# Patient Record
Sex: Male | Born: 1970 | Race: White | Hispanic: No | Marital: Married | Smoking: Never smoker
Health system: Southern US, Community
[De-identification: ages and names within clinical notes are randomized; demographics above are authoritative.]

## PROBLEM LIST (undated history)

## (undated) DIAGNOSIS — R1310 Dysphagia, unspecified: Secondary | ICD-10-CM

## (undated) DIAGNOSIS — R7303 Prediabetes: Secondary | ICD-10-CM

## (undated) DIAGNOSIS — R5383 Other fatigue: Secondary | ICD-10-CM

## (undated) DIAGNOSIS — G4700 Insomnia, unspecified: Secondary | ICD-10-CM

## (undated) DIAGNOSIS — K5900 Constipation, unspecified: Secondary | ICD-10-CM

## (undated) DIAGNOSIS — M1990 Unspecified osteoarthritis, unspecified site: Secondary | ICD-10-CM

## (undated) DIAGNOSIS — B009 Herpesviral infection, unspecified: Secondary | ICD-10-CM

## (undated) DIAGNOSIS — E119 Type 2 diabetes mellitus without complications: Secondary | ICD-10-CM

## (undated) DIAGNOSIS — R0602 Shortness of breath: Secondary | ICD-10-CM

## (undated) HISTORY — DX: Unspecified osteoarthritis, unspecified site: M19.90

## (undated) HISTORY — DX: Other fatigue: R53.83

## (undated) HISTORY — DX: Type 2 diabetes mellitus without complications: E11.9

## (undated) HISTORY — DX: Dysphagia, unspecified: R13.10

## (undated) HISTORY — DX: Insomnia, unspecified: G47.00

## (undated) HISTORY — DX: Constipation, unspecified: K59.00

## (undated) HISTORY — DX: Prediabetes: R73.03

## (undated) HISTORY — DX: Herpesviral infection, unspecified: B00.9

## (undated) HISTORY — DX: Shortness of breath: R06.02

---

## 2005-03-25 ENCOUNTER — Ambulatory Visit: Payer: Self-pay | Admitting: Family Medicine

## 2005-04-12 ENCOUNTER — Ambulatory Visit: Payer: Self-pay | Admitting: Family Medicine

## 2005-11-11 ENCOUNTER — Ambulatory Visit: Payer: Self-pay | Admitting: Internal Medicine

## 2005-11-25 ENCOUNTER — Ambulatory Visit: Payer: Self-pay | Admitting: Internal Medicine

## 2006-12-08 ENCOUNTER — Ambulatory Visit: Payer: Self-pay | Admitting: Family Medicine

## 2006-12-08 DIAGNOSIS — L738 Other specified follicular disorders: Secondary | ICD-10-CM | POA: Insufficient documentation

## 2006-12-18 ENCOUNTER — Telehealth (INDEPENDENT_AMBULATORY_CARE_PROVIDER_SITE_OTHER): Payer: Self-pay | Admitting: *Deleted

## 2006-12-19 ENCOUNTER — Telehealth (INDEPENDENT_AMBULATORY_CARE_PROVIDER_SITE_OTHER): Payer: Self-pay | Admitting: *Deleted

## 2007-05-30 ENCOUNTER — Telehealth: Payer: Self-pay | Admitting: Family Medicine

## 2007-10-23 ENCOUNTER — Telehealth (INDEPENDENT_AMBULATORY_CARE_PROVIDER_SITE_OTHER): Payer: Self-pay | Admitting: *Deleted

## 2007-12-26 ENCOUNTER — Telehealth (INDEPENDENT_AMBULATORY_CARE_PROVIDER_SITE_OTHER): Payer: Self-pay | Admitting: *Deleted

## 2008-10-07 ENCOUNTER — Ambulatory Visit: Payer: Self-pay | Admitting: Family Medicine

## 2008-10-07 DIAGNOSIS — B070 Plantar wart: Secondary | ICD-10-CM | POA: Insufficient documentation

## 2008-10-07 DIAGNOSIS — M5412 Radiculopathy, cervical region: Secondary | ICD-10-CM | POA: Insufficient documentation

## 2008-10-13 ENCOUNTER — Encounter: Payer: Self-pay | Admitting: Family Medicine

## 2008-10-13 ENCOUNTER — Encounter (INDEPENDENT_AMBULATORY_CARE_PROVIDER_SITE_OTHER): Payer: Self-pay | Admitting: *Deleted

## 2008-11-10 ENCOUNTER — Telehealth: Payer: Self-pay | Admitting: Family Medicine

## 2008-11-10 DIAGNOSIS — R209 Unspecified disturbances of skin sensation: Secondary | ICD-10-CM

## 2009-01-29 ENCOUNTER — Telehealth (INDEPENDENT_AMBULATORY_CARE_PROVIDER_SITE_OTHER): Payer: Self-pay | Admitting: *Deleted

## 2009-07-24 ENCOUNTER — Ambulatory Visit: Payer: Self-pay | Admitting: Family Medicine

## 2009-07-24 DIAGNOSIS — L03019 Cellulitis of unspecified finger: Secondary | ICD-10-CM

## 2009-07-24 DIAGNOSIS — J019 Acute sinusitis, unspecified: Secondary | ICD-10-CM

## 2009-07-24 DIAGNOSIS — E663 Overweight: Secondary | ICD-10-CM | POA: Insufficient documentation

## 2010-04-20 NOTE — Assessment & Plan Note (Signed)
Summary: finger nail discolored/cbs   Vital Signs:  Patient profile:   40 year old male Height:      69 inches Weight:      201.25 pounds BMI:     29.83 BP sitting:   122 / 80  (left arm) Cuff size:   regular  Vitals Entered By: Army Fossa CMA (Jul 24, 2009 3:55 PM)  Nutrition Counseling: Patient's BMI is greater than 25 and therefore counseled on weight management options. CC: Jose [name] thumb finger nail has some discoloration. was diagnosied with Strep last saturday, look at throat. Feeling better. Done with ATB- was seen in UC in [place]   History of Present Illness: Jose [name] c/o discoloration of nail on Left hand after piece of orange got under it.   He was put on omnicef for 5 days for strep throat-- he still congested and feels he had a sinus infection.   He also has questions about weight loss.  He is not exercising and admits his diet could be better.   Preventive Screening-Counseling & Management  Caffeine-Diet-Exercise     Diet Comments: poor     Does Patient Exercise: no  Current Medications (verified): 1)  Afrin Sinus 0.05 % Soln (Oxymetazoline Hcl) 2)  Omnicef 300mg  .... 1 By Mouth Two Times A Day 3)  Nasonex 50 Mcg/act Susp (Mometasone Furoate) .... 2 Sprays Each Nostril Once Daily  Allergies (verified): No Known Drug Allergies  Past History:  Past medical, surgical, family and social histories (including risk factors) reviewed for relevance to current acute and chronic problems.  Past Medical History: Reviewed history from 12/08/2006 and no changes required. HSV II  Family History: Reviewed history and no changes required.  Social History: Reviewed history and no changes required. Does Patient Exercise:  no  Review of Systems      See HPI  Physical Exam  General:  Well-developed,well-nourished,in no acute distress; alert,appropriate and cooperative throughout examination Ears:  External ear exam shows no significant lesions or deformities.   Otoscopic examination reveals clear canals, tympanic membranes are intact bilaterally without bulging, retraction, inflammation or discharge. Hearing is grossly normal bilaterally. Nose:  L frontal sinus tenderness, L maxillary sinus tenderness, R frontal sinus tenderness, and R maxillary sinus tenderness.   Mouth:  Oral mucosa and oropharynx without lesions or exudates.  Teeth in good repair. Neck:  No deformities, masses, or tenderness noted. Extremities:  L thumbnail--+ hematoma--tiny no d/c non tender Psych:  Oriented X3 and normally interactive.     Impression & Recommendations:  Problem # 1:  SINUSITIS - ACUTE-NOS (ICD-461.9)  His updated medication list for this problem includes:    Afrin Sinus 0.05 % Soln (Oxymetazoline hcl)    Nasonex 50 Mcg/act Susp (Mometasone furoate) .Marland Kitchen... 2 sprays each nostril once daily    omnicef for 5 more days  Instructed on treatment. Call if symptoms persist or worsen.   Problem # 2:  ONYCHIA AND PARONYCHIA OF FINGER (ICD-681.02)  omnicef 5 more days soak with H2O2 and warm water  Elevate affected area. Warm moist compresses for 20 minutes every 2 hours while awake. Take antibiotics as directed and take acetaminophen as needed. To be seen in 48-72 hours if no improvement, sooner if worse.  Problem # 3:  OVERWEIGHT (ICD-278.02) Assessment: Unchanged  Ht: 69 (07/24/2009)   Wt: 201.25 (07/24/2009)   BMI: 29.83 (07/24/2009) recc.  Flat Belly Diet  by Prevention and increasing exercise and include weight training  Complete Medication List: 1)  Afrin Sinus 0.05 % Soln (Oxymetazoline hcl) 2)  Omnicef 300mg   .... 1 by mouth two times a day 3)  Nasonex 50 Mcg/act Susp (Mometasone furoate) .... 2 sprays each nostril once daily Prescriptions: NASONEX 50 MCG/ACT SUSP (MOMETASONE FUROATE) 2 sprays each nostril once daily  #1 x 0   Entered and Authorized by:   Loreen Freud DO   Signed by:   Loreen Freud DO on 07/24/2009   Method used:   Historical    RxID:   [id number] OMNICEF 300MG  1 by mouth two times a day  #10 x 0   Entered and Authorized by:   Loreen Freud DO   Signed by:   Loreen Freud DO on 07/24/2009   Method used:   Faxed to ...       [facility] [phone]* (retail)       [address]       [place], [place]  [address]       Ph: [phone]       Fax: [phone]   RxID:   [id number]

## 2010-07-20 ENCOUNTER — Telehealth: Payer: Self-pay | Admitting: *Deleted

## 2010-07-20 MED ORDER — ACYCLOVIR 400 MG PO TABS: 400.0000 mg | ORAL_TABLET | Freq: Two times a day (BID) | ORAL | Status: AC

## 2010-07-20 NOTE — Telephone Encounter (Signed)
Pt aware Rx sent to pharmacy 

## 2010-07-20 NOTE — Telephone Encounter (Signed)
Zovirax 400mg   Bid #60  11 refills

## 2010-07-20 NOTE — Telephone Encounter (Signed)
Pt states that due to recent insurance change he would like to change Rx from Valtrex to acyclovir. Pt would like to get med fill for year supply or as many fills as possible. Pt use [facility]. Last OV 07-24-09, last filled valtrex 1 gm, 01-29-09 #90 1. Please advise

## 2011-08-04 ENCOUNTER — Ambulatory Visit (INDEPENDENT_AMBULATORY_CARE_PROVIDER_SITE_OTHER): Payer: Managed Care, Other (non HMO) | Admitting: Family Medicine

## 2011-08-04 ENCOUNTER — Other Ambulatory Visit: Payer: Self-pay | Admitting: Family Medicine

## 2011-08-04 ENCOUNTER — Telehealth: Payer: Self-pay | Admitting: Family Medicine

## 2011-08-04 ENCOUNTER — Encounter: Payer: Self-pay | Admitting: Family Medicine

## 2011-08-04 VITALS — BP 110/73 | HR 88 | Temp 97.7°F | Ht 69.0 in | Wt 195.0 lb

## 2011-08-04 DIAGNOSIS — Z789 Other specified health status: Secondary | ICD-10-CM

## 2011-08-04 DIAGNOSIS — Z9189 Other specified personal risk factors, not elsewhere classified: Secondary | ICD-10-CM

## 2011-08-04 DIAGNOSIS — R197 Diarrhea, unspecified: Secondary | ICD-10-CM

## 2011-08-04 DIAGNOSIS — R5381 Other malaise: Secondary | ICD-10-CM

## 2011-08-04 DIAGNOSIS — J029 Acute pharyngitis, unspecified: Secondary | ICD-10-CM

## 2011-08-04 DIAGNOSIS — B349 Viral infection, unspecified: Secondary | ICD-10-CM | POA: Insufficient documentation

## 2011-08-04 DIAGNOSIS — R5383 Other fatigue: Secondary | ICD-10-CM | POA: Insufficient documentation

## 2011-08-04 DIAGNOSIS — B9789 Other viral agents as the cause of diseases classified elsewhere: Secondary | ICD-10-CM

## 2011-08-04 LAB — POCT RAPID STREP A (OFFICE): Rapid Strep A Screen: NEGATIVE

## 2011-08-04 NOTE — Progress Notes (Signed)
OFFICE NOTE  08/04/2011  CC:  Chief Complaint  Patient presents with  . Fatigue    tick bite 07/23/11, wonders if could be related, also diarrhea, HA     HPI: Patient is a 41 y.o. Caucasian male who is here for chief complaint of fatigue. He describes an illness that began approximately 2 wks ago with mild pain in soft palate area and headache. No fever or significant URI sx's (he had his usual "mild" hay fever sx's) but just a mild cough.  No SOB, no neck pain/stiffness, no rash.  He did have a hx of tick bite (tick on him approx 24-36h) sometime in the week or so prior to onset of sx's.  His wife and children did also have respiratory illnesses at that time. [facility] and "nothing much" was found but with hx of tick bite he was rx'd a 10 day course of doxycycline, which he finishes today.   After getting on the doxy, he felt better after 3-4d, but then the last few days his sx's of HA, palate pain, and mild cough have returned, and most notably today he feels very tired/generalized fatigue, with vague/mild body achiness, L>R side.  No focal weakness.  Still no fevers.  Also has had 2-3 watery BMs/day last few days, one was nocturnal.  No blood noted. No abd cramping.  His appetite is good.  Denies vision or hearing symptoms. No other recent abx besides his current doxycycline.  Pertinent PMH:  Mild seasonal allergic rhinitis.  No signif past illnesses, no chronic illnesses, no past surgeries or procedures.  Social Hx: married 3 children.  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  No T/A/Ds.  MEDS:  Doxycycline ?dose bid x 10d (finishes today) Advil prn. Zyrtec prn  ROS: see HPI.  Also, no wheezing, no chest tightness, no SOB, no nausea, no numbness or tingling anywhere, no swelling or redness of any joints, no palpitations, no chest pain, no dizziness.  PE: Blood pressure 110/73, pulse 88, temperature 97.7 F (36.5 C), temperature source Temporal, height 5\' 9"  (1.753 m), weight 195  lb (88.451 kg). Gen: Alert, well appearing.  Patient is oriented to person, place, time, and situation. ENT: Ears: EACs clear, normal epithelium.  TMs with good light reflex and landmarks bilaterally.  Eyes: no injection, icteris, swelling, or exudate.  EOMI, PERRLA. Nose: no drainage but mild bilateral turbinate congestion, without purulent d/c.  Mouth: lips without lesion/swelling.  Oral mucosa pink and moist.  Dentition intact and without obvious caries or gingival swelling.  Oropharynx without erythema, exudate, or focal lesion.  He has the suggestion of a bit of mild beefy swelling in posterior pharynx area diffusely, ?clear PND.  No convincing palatal lesions to suggest petechiae. NECK: mild ant cerv LAD, mildly tender on right.  No neck stiffness, no tenderness to posterior/lateral musculature of the neck.  No posterior cervical LAD.  Neck ROM normal. CV: RRR, no m/r/g.   LUNGS: CTA bilat, nonlabored resps, good aeration in all lung fields. ABD: soft, NT, ND, BS normal.  No hepatospenomegaly or mass.  No bruits. EXT: no clubbing, cyanosis, or edema.  Skin - no sores or suspicious lesions or rashes or color changes Musc: no joint swelling, erythema, or tenderness  LABS: Rapid strep today was negative.  IMPRESSION AND PLAN:  Viral syndrome He looks a little tired today but otherwise very good. Reassured pt, suggested he go ahead and take last dose of doxy but I recommend no further antibiotics.  In fact, no meds recommended at this time except advil prn and zyrtec prn. I will check a monospot, parvo titers, RMSF and Lyme titers, as well as CBC w/diff, CMET, and TSH. I have low suspicion of tick borne illness at this time and low suspicion of his diarrhea being from c diff at this time. Recommended pt rest, stay home from work if he feels bad again tomorrow, push clear fluids. Discussed f/u plan and decided on prn f/u.   Strep culture was sent.   FOLLOW UP: prn

## 2011-08-04 NOTE — Assessment & Plan Note (Signed)
He looks a little tired today but otherwise very good. Reassured pt, suggested he go ahead and take last dose of doxy but I recommend no further antibiotics. In fact, no meds recommended at this time except advil prn and zyrtec prn. I will check a monospot, parvo titers, RMSF and Lyme titers, as well as CBC w/diff, CMET, and TSH. I have low suspicion of tick borne illness at this time and low suspicion of his diarrhea being from c diff at this time. Recommended pt rest, stay home from work if he feels bad again tomorrow, push clear fluids. Discussed f/u plan and decided on prn f/u.

## 2011-08-04 NOTE — Telephone Encounter (Signed)
aller: Jelan/Patient; PCP: Lelon Perla.; CB#: [phone]; ; ; Call regarding Fatique;   Today, 08/04/2011 , has had onset of  fatique and achiness. No known fever. [facility] 10 days [facility] for sinus pain and congestion ,  dx with sinus  infection and given Doxycyline. Sx's started to improve,  but has developed cough since 08/03/2011- HA  x 2 days  also relieves with Advil.  Also states was bite by tick ~ 15 days ago and wonders if any of the sx's could be related.  No change in the tick bite area. RN reached See in 4 hrs DISP for history of tick bite and has HA and muscle pain per Bite and Stings   Protocol -- See in 24 hrs also reached for  chest pain with cough and deep breath per Cough protocol --  No EPIC appts for today and no appts for Sandford Craze NP  at [facility] office.   RN called office  and " Shrae"  adv pt can be seen at [facility] and for pt to call [phone] or go to [facility] today. RN discussed with pt and he will call [facility] office for appt today.

## 2011-08-05 ENCOUNTER — Other Ambulatory Visit: Payer: Self-pay | Admitting: Family Medicine

## 2011-08-05 ENCOUNTER — Ambulatory Visit (HOSPITAL_BASED_OUTPATIENT_CLINIC_OR_DEPARTMENT_OTHER)
Admission: RE | Admit: 2011-08-05 | Discharge: 2011-08-05 | Disposition: A | Discharge status: Home / Self Care | Payer: Managed Care, Other (non HMO) | Source: Ambulatory Visit | Attending: Family Medicine | Admitting: Family Medicine

## 2011-08-05 ENCOUNTER — Encounter: Payer: Self-pay | Admitting: Family Medicine

## 2011-08-05 DIAGNOSIS — D72829 Elevated white blood cell count, unspecified: Secondary | ICD-10-CM

## 2011-08-05 DIAGNOSIS — R739 Hyperglycemia, unspecified: Secondary | ICD-10-CM | POA: Insufficient documentation

## 2011-08-05 DIAGNOSIS — R5383 Other fatigue: Secondary | ICD-10-CM

## 2011-08-05 DIAGNOSIS — R5381 Other malaise: Secondary | ICD-10-CM | POA: Insufficient documentation

## 2011-08-05 LAB — HEMOGLOBIN A1C
Hgb A1c MFr Bld: 5.2 % (ref <5.7)
Mean Plasma Glucose: 103 mg/dL (ref <117)

## 2011-08-05 LAB — CBC WITH DIFFERENTIAL/PLATELET
Eosinophils Absolute: 0.2 10*3/uL (ref 0.0–0.7)
Eosinophils Relative: 1 % (ref 0–5)
HCT: 41.1 % (ref 39.0–52.0)
Hemoglobin: 14.6 g/dL (ref 13.0–17.0)
Lymphocytes Relative: 5 % — ABNORMAL LOW (ref 12–46)
Lymphs Abs: 1.5 10*3/uL (ref 0.7–4.0)
MCH: 31.5 pg (ref 26.0–34.0)
MCV: 88.6 fL (ref 78.0–100.0)
Monocytes Relative: 4 % (ref 3–12)
RBC: 4.64 MIL/uL (ref 4.22–5.81)

## 2011-08-05 LAB — COMPREHENSIVE METABOLIC PANEL
ALT: 28 U/L (ref 0–53)
Albumin: 4.6 g/dL (ref 3.5–5.2)
BUN: 16 mg/dL (ref 6–23)
CO2: 22 mEq/L (ref 19–32)
Calcium: 9.3 mg/dL (ref 8.4–10.5)
Chloride: 103 mEq/L (ref 96–112)
Creat: 0.99 mg/dL (ref 0.50–1.35)
Potassium: 3.9 mEq/L (ref 3.5–5.3)

## 2011-08-05 LAB — ROCKY MTN SPOTTED FVR ABS PNL(IGG+IGM): RMSF IgG: 0.19 IV

## 2011-08-05 LAB — MONONUCLEOSIS SCREEN: Mono Screen: NEGATIVE

## 2011-08-05 MED ORDER — MOXIFLOXACIN HCL 400 MG PO TABS: 400.0000 mg | ORAL_TABLET | Freq: Every day | ORAL | Status: AC

## 2011-08-05 NOTE — Progress Notes (Signed)
Addended by: Luisa Dago on: 08/05/2011 08:51 AM   Modules accepted: Orders

## 2011-08-08 LAB — PARVOVIRUS B19 ANTIBODY, IGG AND IGM: Parovirus B19 IgM Abs: 0.1 index (ref <0.9)

## 2011-12-25 ENCOUNTER — Other Ambulatory Visit: Payer: Self-pay | Admitting: Family Medicine

## 2011-12-26 NOTE — Telephone Encounter (Signed)
Last seen with Dr.Lowne 07/24/09----- Rx for Acyclovir 200 denied      KP

## 2011-12-26 NOTE — Telephone Encounter (Signed)
agree

## 2012-02-24 ENCOUNTER — Encounter: Payer: Self-pay | Admitting: Family

## 2012-02-24 ENCOUNTER — Ambulatory Visit (INDEPENDENT_AMBULATORY_CARE_PROVIDER_SITE_OTHER): Payer: Managed Care, Other (non HMO) | Admitting: Family

## 2012-02-24 VITALS — BP 130/70 | HR 71 | Temp 98.2°F | Resp 16 | Wt 197.1 lb

## 2012-02-24 DIAGNOSIS — M7989 Other specified soft tissue disorders: Secondary | ICD-10-CM

## 2012-02-24 MED ORDER — CEPHALEXIN 500 MG PO CAPS: 500.0000 mg | ORAL_CAPSULE | Freq: Four times a day (QID) | ORAL | Status: AC

## 2012-02-24 NOTE — Patient Instructions (Addendum)
Please call if symptoms worsen or if not improved in 2-3 days. Follow up with Dr. Laury Axon in 1 week.

## 2012-02-24 NOTE — Assessment & Plan Note (Signed)
?   Due to early abscess which is not yet visible. Will rx with Keflex.  Pt is instructed to call if he develops fever, increased pain, swelling or redness or if symptoms are not improved in 2-3 days.

## 2012-02-24 NOTE — Progress Notes (Signed)
  Subjective:    Patient ID: Jose [name], male    DOB: 02/08/1971, 41 y.o.   MRN: [id number]  HPI  [name] is a 41 yr old male who presents today with chief complaint of tenderness under the left arm.  Tenderness has been present x 10 days.  Reports associated swelling, more painful in afternoon/evening.  In the past he has had similar episodes of tenedernss.  Notes some skin redness since he shaved his arm pit last night which he attributes only to razor burn.   Review of Systems    see HPI  No past medical history on file.  History   Social History  . Marital Status: Married    Spouse Name: N/A    Number of Children: N/A  . Years of Education: N/A   Occupational History  . Not on file.   Social History Main Topics  . Smoking status: Never Smoker   . Smokeless tobacco: Never Used  . Alcohol Use: No  . Drug Use: No  . Sexually Active: Not on file   Other Topics Concern  . Not on file   Social History Narrative  . No narrative on file    No past surgical history on file.  No family history on file.  No Known Allergies  No current outpatient prescriptions on file prior to visit.    BP 130/70  Pulse 71  Temp 98.2 F (36.8 C) (Oral)  Resp 16  Wt 197 lb 1.9 oz (89.413 kg)  SpO2 99%    Objective:   Physical Exam  Constitutional: He appears well-developed and well-nourished. No distress.  Lymphadenopathy:       Left axillary swelling with superficial skin irritation.  No palpable lymph node enlargement, but exam limited due to tenderness and swelling.  No discrete abscess is noted.          Assessment & Plan:

## 2012-06-04 ENCOUNTER — Encounter: Payer: Self-pay | Admitting: Internal Medicine

## 2012-06-04 ENCOUNTER — Ambulatory Visit (INDEPENDENT_AMBULATORY_CARE_PROVIDER_SITE_OTHER): Payer: Managed Care, Other (non HMO) | Admitting: Internal Medicine

## 2012-06-04 VITALS — BP 118/76 | HR 61

## 2012-06-04 DIAGNOSIS — J019 Acute sinusitis, unspecified: Secondary | ICD-10-CM

## 2012-06-04 MED ORDER — AMOXICILLIN 500 MG PO CAPS: 500.0000 mg | ORAL_CAPSULE | Freq: Three times a day (TID) | ORAL | Status: DC

## 2012-06-04 MED ORDER — FLUTICASONE PROPIONATE 50 MCG/ACT NA SUSP: 1.0000 | Freq: Two times a day (BID) | NASAL | Status: DC | PRN

## 2012-06-04 NOTE — Progress Notes (Signed)
  Subjective:    Patient ID: Jose [name], male    DOB: 1970-09-02, 42 y.o.   MRN: [id number]  HPI  The respiratory tract symptoms began 05/27/12 as rhinitis & head congestion.  No exposures reported  to sick family, friends, work associates, allergens, or environmental factors as triggers .  Significant active  associated symptoms include nasal purulence & dry cough.  Cough is not  associated with wheezing  & dyspnea .     Extrinsic symptoms of itchy/ watery eyes &  sneezing were present .    Flu shot current   Treatment with Afrin,  OTC allergy med was partially effective. There is no history of asthma; PMH of  seasonal  allergies.  The patient had never smoked   OD upper lid lesion X 3 weeks, ? Herpetic as better with Valtrex                   Review of Systems Symptoms not present include frontal headache,  facial pain, dental pain,  sore throat, earache, &  otic discharge. Fever, chills, sweats were not present . Myalgias and arthralgias were not present.     Objective:   Physical Exam General appearance:good health ;well nourished; no acute distress or increased work of breathing is present.   Eyes: No conjunctival inflammation or lid edema is present. EOMI & vision normal with lenses Ears:  External ear exam shows no significant lesions or deformities.  Otoscopic examination reveals clear canals, tympanic membranes are intact bilaterally without bulging, retraction, inflammation or discharge. Nose:  External nasal examination shows no deformity or inflammation. Nasal mucosa are pink and moist without lesions or exudates. No septal dislocation or deviation.No obstruction to airflow.  Oral exam: Dental hygiene is good; lips and gums are healthy appearing.There is no oropharyngeal erythema or exudate noted.  Neck:  No deformities, masses, or tenderness noted.   Supple with full range of motion without pain.  Heart:  Normal rate and regular rhythm. S1 and S2  normal without gallop, click, rub or murmur.  Grade 1 murmur Lungs:Chest clear to auscultation; no wheezes, rhonchi,rales ,or rubs present.No increased work of breathing.   Extremities:  No cyanosis, edema, or clubbing  noted  No  lymphadenopathy about the head, neck, or axilla noted.  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 See prescription medications

## 2012-06-04 NOTE — Patient Instructions (Addendum)
Plain Mucinex (NOT D) for thick secretions ;force NON dairy fluids .   Nasal cleansing in the shower as discussed with lather of mild shampoo.After 10 seconds wash off lather while  exhaling through nostrils. Make sure that all residual soap is removed to prevent irritation.  Fluticasone 1 spray in each nostril twice a day as needed. Use the "crossover" technique into opposite nostril spraying toward opposite ear @ 45 degree angle, not straight up into nostril.  Use a Neti pot daily only  as needed for significant sinus congestion; going from open side to congested side . Plain Allegra (NOT D )  160 daily , Loratidine 10 mg , OR Zyrtec 10 mg @ bedtime  as needed for itchy eyes & sneezing.  Apply Cort Aid OTC twice a day to the involved tissues. Do not get this topical steroid into eyes.

## 2012-10-26 ENCOUNTER — Ambulatory Visit (INDEPENDENT_AMBULATORY_CARE_PROVIDER_SITE_OTHER): Payer: Managed Care, Other (non HMO) | Admitting: Internal Medicine

## 2012-10-26 VITALS — BP 126/64 | HR 62 | Temp 98.4°F | Wt 200.0 lb

## 2012-10-26 DIAGNOSIS — K648 Other hemorrhoids: Secondary | ICD-10-CM

## 2012-10-26 MED ORDER — HYDROCORTISONE ACETATE 25 MG RE SUPP: 25.0000 mg | Freq: Two times a day (BID) | RECTAL | Status: DC | PRN

## 2012-10-26 NOTE — Progress Notes (Signed)
  Subjective:    Patient ID: Jose [name], male    DOB: 1970/07/02, 42 y.o.   MRN: [id number]  HPI Acute visit Few months history of  anal itching. Every 10 days he sees bright fresh blood per rectum usually associated with a large bowel movement, amount is less than 1 teaspoon, after that usually he sees a small amount of red blood  over the next few days until it stops. Very little pain if any, some swelling?. Has ot use any particular medicine for his sx   No past medical history on file.  No past surgical history on file.  Review of Systems No fever chills, no weight loss No nausea, vomiting, diarrhea. No abdominal pain. Stools themselves are normal.     Objective:   Physical Exam General -- alert, well-developed, NAD  HEENT -- not pale  Abdomen--soft, non-tender, no distention, no masses  Extremities-- no pretibial edema bilaterally DRE--  External appearance normal No masses, brown stools, prostate hard to reach due to patient discomfort. Anoscopy: Several small hemorrhoids, largest was at around 4:00, no active bleeding. No mucus.  Psych-- Cognition and judgment appear intact. Alert and cooperative with normal attention span and concentration.  not anxious appearing and not depressed appearing.       Assessment & Plan:  Internal hemorrhoids, New problem Patient symptoms likely due to internal hemorrhoids, recommend conservative treatment with increase fiber diet, anusol-nupercainal  as needed. If he is not improving or if he has severe symptoms, I recommend him to call me, he will need to see GI to rule out a more proximal etiology for his symptoms

## 2012-10-26 NOTE — Patient Instructions (Addendum)
Always drink plenty of fluid Have to diet high in fiber and/or take Metamucil 2 capsules daily with fluids For the days that you have bleeding and severe itching, uses suppositories as needed Apply   OTC Nupercainal as needed for itching Please call if you have severe or persistent symptoms or pain. You will need to see a specialist.   Hemorrhoids Hemorrhoids are swollen veins around the rectum or anus. There are two types of hemorrhoids:   Internal hemorrhoids. These occur in the veins just inside the rectum. They may poke through to the outside and become irritated and painful.  External hemorrhoids. These occur in the veins outside the anus and can be felt as a painful swelling or hard lump near the anus. CAUSES  Pregnancy.   Obesity.   Constipation or diarrhea.   Straining to have a bowel movement.   Sitting for long periods on the toilet.  Heavy lifting or other activity that caused you to strain.  Anal intercourse. SYMPTOMS   Pain.   Anal itching or irritation.   Rectal bleeding.   Fecal leakage.   Anal swelling.   One or more lumps around the anus.  DIAGNOSIS  Your caregiver may be able to diagnose hemorrhoids by visual examination. Other examinations or tests that may be performed include:   Examination of the rectal area with a gloved hand (digital rectal exam).   Examination of anal canal using a small tube (scope).   A blood test if you have lost a significant amount of blood.  A test to look inside the colon (sigmoidoscopy or colonoscopy). TREATMENT Most hemorrhoids can be treated at home. However, if symptoms do not seem to be getting better or if you have a lot of rectal bleeding, your caregiver may perform a procedure to help make the hemorrhoids get smaller or remove them completely. Possible treatments include:   Placing a rubber band at the base of the hemorrhoid to cut off the circulation (rubber band ligation).   Injecting a  chemical to shrink the hemorrhoid (sclerotherapy).   Using a tool to burn the hemorrhoid (infrared light therapy).   Surgically removing the hemorrhoid (hemorrhoidectomy).   Stapling the hemorrhoid to block blood flow to the tissue (hemorrhoid stapling).  HOME CARE INSTRUCTIONS   Eat foods with fiber, such as whole grains, beans, nuts, fruits, and vegetables. Ask your doctor about taking products with added fiber in them (fibersupplements).  Increase fluid intake. Drink enough water and fluids to keep your urine clear or pale yellow.   Exercise regularly.   Go to the bathroom when you have the urge to have a bowel movement. Do not wait.   Avoid straining to have bowel movements.   Keep the anal area dry and clean. Use wet toilet paper or moist towelettes after a bowel movement.   Medicated creams and suppositories may be used or applied as directed.   Only take over-the-counter or prescription medicines as directed by your caregiver.   Take warm sitz baths for 15 20 minutes, 3 4 times a day to ease pain and discomfort.   Place ice packs on the hemorrhoids if they are tender and swollen. Using ice packs between sitz baths may be helpful.   Put ice in a plastic bag.   Place a towel between your skin and the bag.   Leave the ice on for 15 20 minutes, 3 4 times a day.   Do not use a donut-shaped pillow or sit on the toilet  for long periods. This increases blood pooling and pain.  SEEK MEDICAL CARE IF:  You have increasing pain and swelling that is not controlled by treatment or medicine.  You have uncontrolled bleeding.  You have difficulty or you are unable to have a bowel movement.  You have pain or inflammation outside the area of the hemorrhoids. MAKE SURE YOU:  Understand these instructions.  Will watch your condition.  Will get help right away if you are not doing well or get worse. Document Released: 03/04/2000 Document Revised: 02/22/2012  Document Reviewed: 01/10/2012 [facility] Patient Information 2014 [place], [place].

## 2012-10-28 ENCOUNTER — Encounter: Payer: Self-pay | Admitting: Internal Medicine

## 2013-03-22 ENCOUNTER — Ambulatory Visit (INDEPENDENT_AMBULATORY_CARE_PROVIDER_SITE_OTHER): Payer: BC Managed Care – PPO | Admitting: Family Medicine

## 2013-03-22 ENCOUNTER — Encounter: Payer: Self-pay | Admitting: Family Medicine

## 2013-03-22 ENCOUNTER — Ambulatory Visit: Payer: Managed Care, Other (non HMO) | Admitting: Family Medicine

## 2013-03-22 VITALS — BP 120/76 | HR 78 | Temp 97.4°F | Wt 203.0 lb

## 2013-03-22 DIAGNOSIS — J019 Acute sinusitis, unspecified: Secondary | ICD-10-CM

## 2013-03-22 MED ORDER — CEFUROXIME AXETIL 500 MG PO TABS: 500.0000 mg | ORAL_TABLET | Freq: Two times a day (BID) | ORAL | Status: AC

## 2013-03-22 MED ORDER — MOMETASONE FUROATE 50 MCG/ACT NA SUSP: 2.0000 | Freq: Every day | NASAL | Status: DC

## 2013-03-22 NOTE — Progress Notes (Signed)
  Subjective:     Jose Jose [name] is a 43 y.o. male who presents for evaluation of sinus pain. Symptoms include: facial pain, nasal congestion and sinus pressure. Onset of symptoms was 7 days ago. Symptoms have been gradually worsening since that time. Past history is significant for Jose [name] history of pneumonia or bronchitis. Patient is a non-smoker.  The following portions of the patient's history were reviewed and updated as appropriate: allergies, current medications, past family history, past medical history, past social history, past surgical history and problem list.  Review of Systems Pertinent items are noted in HPI.   Objective:    BP 120/76  Pulse 78  Temp(Src) 97.4 F (36.3 C) (Oral)  Wt 203 lb (92.08 kg)  SpO2 97% General appearance: alert, cooperative, appears stated age and Jose [name] distress Ears: normal TM's and external ear canals both ears Nose: green discharge, moderate congestion, turbinates red, swollen, Jose [name] sinus tenderness Throat: lips, mucosa, and tongue normal; teeth and gums normal Neck: moderate anterior cervical adenopathy, Jose [name] JVD, supple, symmetrical, trachea midline and thyroid not enlarged, symmetric, Jose [name] tenderness/mass/nodules Lungs: clear to auscultation bilaterally Heart: S1, S2 normal    Assessment:    Acute bacterial sinusitis.    Plan:    Nasal steroids per medication orders. Antihistamines per medication orders. Ceftin per medication orders.

## 2013-03-22 NOTE — Patient Instructions (Signed)

## 2013-03-22 NOTE — Progress Notes (Signed)
Pre visit review using our clinic review tool, if applicable. No additional management support is needed unless otherwise documented below in the visit note. 

## 2013-04-03 ENCOUNTER — Ambulatory Visit: Payer: BC Managed Care – PPO | Admitting: Internal Medicine

## 2013-05-29 ENCOUNTER — Encounter: Payer: Self-pay | Admitting: Family Medicine

## 2013-05-29 ENCOUNTER — Ambulatory Visit (INDEPENDENT_AMBULATORY_CARE_PROVIDER_SITE_OTHER): Payer: BC Managed Care – PPO | Admitting: Family Medicine

## 2013-05-29 VITALS — BP 120/70 | HR 69 | Temp 97.7°F | Wt 201.0 lb

## 2013-05-29 DIAGNOSIS — Z9109 Other allergy status, other than to drugs and biological substances: Secondary | ICD-10-CM

## 2013-05-29 DIAGNOSIS — Z0000 Encounter for general adult medical examination without abnormal findings: Secondary | ICD-10-CM

## 2013-05-29 LAB — HEPATIC FUNCTION PANEL
ALT: 50 U/L (ref 0–53)
AST: 45 U/L — ABNORMAL HIGH (ref 0–37)
Albumin: 4.6 g/dL (ref 3.5–5.2)
Alkaline Phosphatase: 57 U/L (ref 39–117)
Bilirubin, Direct: 0.2 mg/dL (ref 0.0–0.3)
TOTAL PROTEIN: 7 g/dL (ref 6.0–8.3)
Total Bilirubin: 0.8 mg/dL (ref 0.3–1.2)

## 2013-05-29 LAB — BASIC METABOLIC PANEL
BUN: 15 mg/dL (ref 6–23)
CHLORIDE: 104 meq/L (ref 96–112)
CO2: 27 meq/L (ref 19–32)
Calcium: 9.5 mg/dL (ref 8.4–10.5)
Creatinine, Ser: 1.1 mg/dL (ref 0.4–1.5)
GFR: 79.09 mL/min (ref >60.00)
Glucose, Bld: 138 mg/dL — ABNORMAL HIGH (ref 70–99)
POTASSIUM: 3.9 meq/L (ref 3.5–5.1)
Sodium: 138 mEq/L (ref 135–145)

## 2013-05-29 LAB — LIPID PANEL
Cholesterol: 199 mg/dL (ref 0–200)
HDL: 36.3 mg/dL — ABNORMAL LOW (ref >39.00)
LDL Cholesterol: 131 mg/dL — ABNORMAL HIGH (ref 0–99)
Total CHOL/HDL Ratio: 5
Triglycerides: 157 mg/dL — ABNORMAL HIGH (ref 0.0–149.0)
VLDL: 31.4 mg/dL (ref 0.0–40.0)

## 2013-05-29 LAB — CBC WITH DIFFERENTIAL/PLATELET
Basophils Absolute: 0 10*3/uL (ref 0.0–0.1)
Basophils Relative: 0.5 % (ref 0.0–3.0)
EOS ABS: 0.5 10*3/uL (ref 0.0–0.7)
EOS PCT: 7.7 % — AB (ref 0.0–5.0)
HCT: 42.9 % (ref 39.0–52.0)
Hemoglobin: 14.7 g/dL (ref 13.0–17.0)
LYMPHS ABS: 1.8 10*3/uL (ref 0.7–4.0)
Lymphocytes Relative: 30.6 % (ref 12.0–46.0)
MCHC: 34.3 g/dL (ref 30.0–36.0)
MCV: 93.7 fl (ref 78.0–100.0)
MONO ABS: 0.4 10*3/uL (ref 0.1–1.0)
Monocytes Relative: 7.5 % (ref 3.0–12.0)
Neutro Abs: 3.2 10*3/uL (ref 1.4–7.7)
Neutrophils Relative %: 53.7 % (ref 43.0–77.0)
PLATELETS: 219 10*3/uL (ref 150.0–400.0)
RBC: 4.58 Mil/uL (ref 4.22–5.81)
RDW: 13.5 % (ref 11.5–14.6)
WBC: 6 10*3/uL (ref 4.5–10.5)

## 2013-05-29 LAB — PSA: PSA: 0.63 ng/mL (ref 0.10–4.00)

## 2013-05-29 LAB — TSH: TSH: 2.43 u[IU]/mL (ref 0.35–5.50)

## 2013-05-29 MED ORDER — AZELASTINE-FLUTICASONE 137-50 MCG/ACT NA SUSP: 1.0000 | Freq: Two times a day (BID) | NASAL | Status: DC

## 2013-05-29 MED ORDER — LEVOCETIRIZINE DIHYDROCHLORIDE 5 MG PO TABS: 5.0000 mg | ORAL_TABLET | Freq: Every evening | ORAL | Status: DC

## 2013-05-29 NOTE — Progress Notes (Signed)
Pre visit review using our clinic review tool, if applicable. No additional management support is needed unless otherwise documented below in the visit note. 

## 2013-05-29 NOTE — Progress Notes (Signed)
  Subjective:     Tal Kempker is a 43 y.o. male who presents for evaluation and treatment of allergic symptoms. Symptoms include: clear rhinorrhea, itchy eyes, itchy nose and sneezing and are present in a seasonal pattern. Precipitants include: unknown. Treatment currently includes intranasal steroids: nasonex, oral antihistamines: zyrtec and is not effective. The following portions of the patient's history were reviewed and updated as appropriate: allergies, current medications, past family history, past medical history, past social history, past surgical history and problem list.  Review of Systems Pertinent items are noted in HPI.    Objective:    BP 120/70  Pulse 69  Temp(Src) 97.7 F (36.5 C) (Oral)  Wt 201 lb (91.173 kg)  SpO2 97% General appearance: alert, cooperative, appears stated age and no distress Nose: clear discharge, mild congestion, turbinates red, swollen, no sinus tenderness Throat: lips, mucosa, and tongue normal; teeth and gums normal Neck: no adenopathy, supple, symmetrical, trachea midline and thyroid not enlarged, symmetric, no tenderness/mass/nodules Lungs: clear to auscultation bilaterally    Assessment:    Allergic rhinitis.    Plan:    Medications: dymista--xyzal. Allergen avoidance discussed. Follow-up in a few weeks. --prn Refer allergy

## 2013-05-29 NOTE — Patient Instructions (Signed)

## 2014-08-29 ENCOUNTER — Ambulatory Visit (INDEPENDENT_AMBULATORY_CARE_PROVIDER_SITE_OTHER): Payer: BLUE CROSS/BLUE SHIELD | Admitting: Family Medicine

## 2014-08-29 ENCOUNTER — Encounter: Payer: Self-pay | Admitting: Family Medicine

## 2014-08-29 ENCOUNTER — Telehealth: Payer: Self-pay | Admitting: Family Medicine

## 2014-08-29 VITALS — BP 125/86 | HR 77 | Temp 98.2°F | Ht 69.0 in | Wt 201.8 lb

## 2014-08-29 DIAGNOSIS — Z23 Encounter for immunization: Secondary | ICD-10-CM | POA: Insufficient documentation

## 2014-08-29 DIAGNOSIS — G4700 Insomnia, unspecified: Secondary | ICD-10-CM | POA: Diagnosis not present

## 2014-08-29 DIAGNOSIS — B009 Herpesviral infection, unspecified: Secondary | ICD-10-CM | POA: Diagnosis not present

## 2014-08-29 MED ORDER — VALACYCLOVIR HCL 1 G PO TABS: 1000.0000 mg | ORAL_TABLET | Freq: Every day | ORAL | Status: DC

## 2014-08-29 MED ORDER — TYPHOID VACCINE PO CPDR: 1.0000 | DELAYED_RELEASE_CAPSULE | ORAL | Status: DC

## 2014-08-29 MED ORDER — VALACYCLOVIR HCL 500 MG PO TABS: 500.0000 mg | ORAL_TABLET | Freq: Every day | ORAL | Status: DC

## 2014-08-29 MED ORDER — ZOLPIDEM TARTRATE 5 MG PO TABS: 5.0000 mg | ORAL_TABLET | Freq: Every evening | ORAL | Status: DC | PRN

## 2014-08-29 NOTE — Addendum Note (Signed)
Addended by: Eduard Roux E on: 08/29/2014 01:54 PM   Modules accepted: Orders

## 2014-08-29 NOTE — Telephone Encounter (Signed)
Rx faxed for the 1 gm and the patient verbalized understanding.    KP

## 2014-08-29 NOTE — Progress Notes (Signed)
Subjective:    Patient ID: Jose [name], male    DOB: [birth year], 44 y.o.   MRN: [id number]  HPI  Patient here for refill on ambien for flying and valtrex.  He is also leaving Sunday for [place] and needs typhoid vac and hepa/b. No other complaints.    Past Medical History  Diagnosis Date  . Herpes   . Insomnia     Review of Systems  Constitutional: Negative for diaphoresis, appetite change, fatigue and unexpected weight change.  Eyes: Negative for pain, redness and visual disturbance.  Respiratory: Negative for cough, chest tightness, shortness of breath and wheezing.   Cardiovascular: Negative for chest pain, palpitations and leg swelling.  Endocrine: Negative for cold intolerance, heat intolerance, polydipsia, polyphagia and polyuria.  Genitourinary: Negative for dysuria, frequency and difficulty urinating.  Neurological: Negative for dizziness, light-headedness, numbness and headaches.  Psychiatric/Behavioral: Positive for sleep disturbance. Negative for dysphoric mood and decreased concentration. The patient is not nervous/anxious.     Current Outpatient Prescriptions on File Prior to Visit  Medication Sig Dispense Refill  . levocetirizine (XYZAL) 5 MG tablet Take 1 tablet (5 mg total) by mouth every evening. 30 tablet 11   No current facility-administered medications on file prior to visit.       Objective:    Physical Exam  Constitutional: He is oriented to person, place, and time. Vital signs are normal. He appears well-developed and well-nourished. He is sleeping.  HENT:  Head: Normocephalic and atraumatic.  Mouth/Throat: Oropharynx is clear and moist.  Eyes: EOM are normal. Pupils are equal, round, and reactive to light.  Neck: Normal range of motion. Neck supple. No thyromegaly present.  Cardiovascular: Normal rate and regular rhythm.   No murmur heard. Pulmonary/Chest: Effort normal and breath sounds normal. No respiratory distress. He has no wheezes. He has  no rales. He exhibits no tenderness.  Musculoskeletal: He exhibits no edema or tenderness.  Neurological: He is alert and oriented to person, place, and time.  Skin: Skin is warm and dry.  Psychiatric: He has a normal mood and affect. His behavior is normal. Judgment and thought content normal.  Nursing note and vitals reviewed.   BP 125/86 mmHg  Pulse 77  Temp(Src) 98.2 F (36.8 C) (Oral)  Ht 5\' 9"  (1.753 m)  Wt 201 lb 12.8 oz (91.536 kg)  BMI 29.79 kg/m2  SpO2 98% Wt Readings from Last 3 Encounters:  08/29/14 201 lb 12.8 oz (91.536 kg)  05/29/13 201 lb (91.173 kg)  03/22/13 203 lb (92.08 kg)     Lab Results  Component Value Date   WBC 6.0 05/29/2013   HGB 14.7 05/29/2013   HCT 42.9 05/29/2013   PLT 219.0 05/29/2013   GLUCOSE 138* 05/29/2013   CHOL 199 05/29/2013   TRIG 157.0* 05/29/2013   HDL 36.30* 05/29/2013   LDLCALC 131* 05/29/2013   ALT 50 05/29/2013   AST 45* 05/29/2013   NA 138 05/29/2013   K 3.9 05/29/2013   CL 104 05/29/2013   CREATININE 1.1 05/29/2013   BUN 15 05/29/2013   CO2 27 05/29/2013   TSH 2.43 05/29/2013   PSA 0.63 05/29/2013   HGBA1C 5.2 08/04/2011       Assessment & Plan:   Problem List Items Addressed This Visit    Need for hepatitis A and B vaccination    Hep A/B given Pt thinks he may have received one a few years ago--- if he has --- #3 in 5 months if not #2 in  1 month       Other Visit Diagnoses    HSV (herpes simplex virus) infection    -  Primary    Relevant Medications    valACYclovir (VALTREX) 500 MG tablet    typhoid (VIVOTIF) DR capsule    Need for typhus vaccination        Relevant Medications    typhoid (VIVOTIF) DR capsule    Insomnia        Relevant Medications    zolpidem (AMBIEN) 5 MG tablet       I have discontinued Mr. Vandemark's Azelastine-Fluticasone. I have also changed his valACYclovir. Additionally, I am having him start on typhoid and zolpidem. Lastly, I am having him maintain his  levocetirizine.  Meds ordered this encounter  Medications  . valACYclovir (VALTREX) 500 MG tablet    Sig: Take 1 tablet (500 mg total) by mouth daily.    Dispense:  90 tablet    Refill:  3  . typhoid (VIVOTIF) DR capsule    Sig: Take 1 capsule by mouth every other day.    Dispense:  4 capsule    Refill:  0  . zolpidem (AMBIEN) 5 MG tablet    Sig: Take 1 tablet (5 mg total) by mouth at bedtime as needed for sleep.    Dispense:  15 tablet    Refill:  [place], DO

## 2014-08-29 NOTE — Patient Instructions (Signed)
Typhoid Vaccine, Live oral enteric-coated capsules What is this medicine? TYPHOID ORAL VACCINE (TYE foid vax EEN) is used to prevent typhoid infection. The vaccine is recommended if you travel to parts of the world where typhoid is common. This medicine may be used for other purposes; ask your health care provider or pharmacist if you have questions. COMMON BRAND NAME(S): Vivotif Berna What should I tell my health care provider before I take this medicine? They need to know if you have any of these conditions: -active infection with fever -cancer -HIV or AIDS -immune system problems -low blood counts, like low white cell, platelet, or red cell counts -recent or ongoing radiation therapy -stomach or intestine problems -an unusual or allergic reaction to vaccines, yeast, other medicines, foods, dyes, or preservatives -pregnant or trying to get pregnant -breast-feeding How should I use this medicine? Take this medicine by mouth with a glass of water. It will only be given to you by a health care professional. Take this medicine on an empty stomach, at least 1 hour before food. Do not take with food. Do not cut, crush or chew this medicine. A copy of Vaccine Information Statements will be given before each vaccination. Read this sheet carefully each time. The sheet may change frequently. Talk to your pediatrician regarding the use of this medicine in children. While this drug may be prescribed for children as young as 6 years for selected conditions, precautions do apply. Overdosage: If you think you've taken too much of this medicine contact a poison control center or emergency room at once. Overdosage: If you think you have taken too much of this medicine contact a poison control center or emergency room at once. NOTE: This medicine is only for you. Do not share this medicine with others. What if I miss a dose? Keep appointments for follow-up doses as directed. It is important not to miss your  dose. Call your doctor or health care professional if you are unable to keep an appointment. Four doses, given 2 days apart, are needed for protection. The last dose should be given at least 1 week before travel to allow the vaccine time to work. What may interact with this medicine? Do not take this medicine with any of the following medications: -certain antibiotics like sulfamethoxazole (SMZ) or other sulfonamides This medicine may also interact with the following medications: -antimalarial drugs -immune globulins -medicines for organ transplant -medicines to treat cancer -other vaccines -some medicines for arthritis -steroid medicines like prednisone or cortisone This list may not describe all possible interactions. Give your health care provider a list of all the medicines, herbs, non-prescription drugs, or dietary supplements you use. Also tell them if you smoke, drink alcohol, or use illegal drugs. Some items may interact with your medicine. What should I watch for while using this medicine? This vaccine, like all vaccines, may not fully protect everyone. Report any side effects that are worrisome to your doctor right away. What side effects may I notice from receiving this medicine? Side effects that you should report to your doctor or health care professional as soon as possible: -allergic reactions like skin rash, itching or hives, swelling of the face, lips, or tongue Side effects that usually do not require medical attention (Report these to your doctor or health care professional if they continue or are bothersome.): -diarrhea -fever -headache -nausea, vomiting -stomach pain This list may not describe all possible side effects. Call your doctor for medical advice about side effects. You may report  side effects to FDA at 1-800-FDA-1088. Where should I keep my medicine? Keep out of the reach of children. Store refrigerated between 2 and 8 degrees C (35.6 and 46.4 degrees F). Do  not freeze. Keep this vaccine in the original container. This vaccine is given in a clinic, pharmacy, doctor's office, or other health care setting and will not be stored at home. NOTE: This sheet is a summary. It may not cover all possible information. If you have questions about this medicine, talk to your doctor, pharmacist, or health care provider.  2015, Elsevier/Gold Standard. (2009-06-08 10:01:11)

## 2014-08-29 NOTE — Progress Notes (Signed)
Pre visit review using our clinic review tool, if applicable. No additional management support is needed unless otherwise documented below in the visit note. 

## 2014-08-29 NOTE — Telephone Encounter (Signed)
Caller name: Elsie Relation to pt: self Call back number: [phone] Pharmacy: [facility] [address]  Reason for call:   Patient states that he is usually prescribed 1000mg  of valtrex and wants to know why 500mg  was called in

## 2014-08-29 NOTE — Assessment & Plan Note (Signed)
Hep A/B given Pt thinks he may have received one a few years ago--- if he has --- #3 in 5 months if not #2 in 1 month

## 2014-09-15 ENCOUNTER — Telehealth: Payer: Self-pay | Admitting: Family Medicine

## 2014-09-15 ENCOUNTER — Ambulatory Visit: Payer: BLUE CROSS/BLUE SHIELD | Admitting: Family Medicine

## 2014-09-15 NOTE — Telephone Encounter (Signed)
no

## 2014-09-15 NOTE — Telephone Encounter (Signed)
Pt left message on VM to cancel appt early this am. He did call back to make sure we got the message. NOS charge?

## 2014-09-16 ENCOUNTER — Encounter: Payer: Self-pay | Admitting: Family Medicine

## 2014-11-20 ENCOUNTER — Telehealth: Payer: Self-pay | Admitting: Family Medicine

## 2014-11-20 NOTE — Telephone Encounter (Signed)
I called [facility] and the patient is seeing Dr.Herrick at [facility]. Results have been faxed.      KP

## 2014-11-20 NOTE — Telephone Encounter (Signed)
Spoke with patient and he asked if the results could be faxed to [phone] but he did not know which specialist he was seeing.     KP

## 2014-11-20 NOTE — Telephone Encounter (Signed)
Caller name: Eagan Shifflett Relationship to patient: Self  Can be reached: [phone] Pharmacy:  Reason for call: Pt what to know if he has ever had a PSA done and if so when? He says that he is going to a specialist tomorrow and they need to know .   Please call back to advise.

## 2015-01-19 ENCOUNTER — Telehealth: Payer: Self-pay | Admitting: Family Medicine

## 2015-01-19 MED ORDER — LEVOCETIRIZINE DIHYDROCHLORIDE 5 MG PO TABS: 5.0000 mg | ORAL_TABLET | Freq: Every evening | ORAL | Status: DC

## 2015-01-19 MED ORDER — VALACYCLOVIR HCL 1 G PO TABS: 1000.0000 mg | ORAL_TABLET | Freq: Every day | ORAL | Status: DC

## 2015-01-19 MED ORDER — MONTELUKAST SODIUM 10 MG PO TABS: 10.0000 mg | ORAL_TABLET | Freq: Every day | ORAL | Status: DC

## 2015-01-19 NOTE — Telephone Encounter (Signed)
Ok to refill valtrex for a year singulair 10 mg qhs #30  11 refills

## 2015-01-19 NOTE — Telephone Encounter (Signed)
Spoke with patient and he stated he wanted to start the Singulair as previously discussed. Please advise      KP

## 2015-01-19 NOTE — Telephone Encounter (Signed)
Relation to XV:QMGQ Call back number:[phone] Pharmacy: [id number] - [place], [place] - [address] AT [place] [phone] (Phone) [phone] (Fax)        Reason for call:  Patient requesting a 90 day supply valACYclovir (VALTREX) 1000 MG tablet and patient inquiring about montelukast 10 mg and montelukast 10 mg. Please advise

## 2015-01-20 NOTE — Telephone Encounter (Signed)
Rx faxed.    KP 

## 2016-03-08 ENCOUNTER — Other Ambulatory Visit: Payer: Self-pay | Admitting: Family Medicine

## 2016-05-23 DIAGNOSIS — J3081 Allergic rhinitis due to animal (cat) (dog) hair and dander: Secondary | ICD-10-CM | POA: Diagnosis not present

## 2016-05-23 DIAGNOSIS — J3089 Other allergic rhinitis: Secondary | ICD-10-CM | POA: Diagnosis not present

## 2016-05-23 DIAGNOSIS — J301 Allergic rhinitis due to pollen: Secondary | ICD-10-CM | POA: Diagnosis not present

## 2016-11-01 DIAGNOSIS — M722 Plantar fascial fibromatosis: Secondary | ICD-10-CM | POA: Diagnosis not present

## 2016-11-26 DIAGNOSIS — H1089 Other conjunctivitis: Secondary | ICD-10-CM | POA: Diagnosis not present

## 2016-12-08 DIAGNOSIS — H10013 Acute follicular conjunctivitis, bilateral: Secondary | ICD-10-CM | POA: Diagnosis not present

## 2016-12-16 DIAGNOSIS — H1013 Acute atopic conjunctivitis, bilateral: Secondary | ICD-10-CM | POA: Diagnosis not present

## 2017-01-06 DIAGNOSIS — M9901 Segmental and somatic dysfunction of cervical region: Secondary | ICD-10-CM | POA: Diagnosis not present

## 2017-01-06 DIAGNOSIS — M9902 Segmental and somatic dysfunction of thoracic region: Secondary | ICD-10-CM | POA: Diagnosis not present

## 2017-01-06 DIAGNOSIS — M546 Pain in thoracic spine: Secondary | ICD-10-CM | POA: Diagnosis not present

## 2017-01-06 DIAGNOSIS — M5413 Radiculopathy, cervicothoracic region: Secondary | ICD-10-CM | POA: Diagnosis not present

## 2017-01-09 DIAGNOSIS — M5413 Radiculopathy, cervicothoracic region: Secondary | ICD-10-CM | POA: Diagnosis not present

## 2017-01-09 DIAGNOSIS — M9902 Segmental and somatic dysfunction of thoracic region: Secondary | ICD-10-CM | POA: Diagnosis not present

## 2017-01-09 DIAGNOSIS — M546 Pain in thoracic spine: Secondary | ICD-10-CM | POA: Diagnosis not present

## 2017-01-09 DIAGNOSIS — M9901 Segmental and somatic dysfunction of cervical region: Secondary | ICD-10-CM | POA: Diagnosis not present

## 2017-03-05 DIAGNOSIS — J069 Acute upper respiratory infection, unspecified: Secondary | ICD-10-CM | POA: Diagnosis not present

## 2017-04-23 DIAGNOSIS — J3089 Other allergic rhinitis: Secondary | ICD-10-CM | POA: Diagnosis not present

## 2017-04-23 DIAGNOSIS — J3081 Allergic rhinitis due to animal (cat) (dog) hair and dander: Secondary | ICD-10-CM | POA: Diagnosis not present

## 2017-04-23 DIAGNOSIS — J301 Allergic rhinitis due to pollen: Secondary | ICD-10-CM | POA: Diagnosis not present

## 2017-06-06 DIAGNOSIS — F909 Attention-deficit hyperactivity disorder, unspecified type: Secondary | ICD-10-CM | POA: Diagnosis not present

## 2018-03-23 ENCOUNTER — Ambulatory Visit (INDEPENDENT_AMBULATORY_CARE_PROVIDER_SITE_OTHER): Payer: BLUE CROSS/BLUE SHIELD | Admitting: Family Medicine

## 2018-03-23 ENCOUNTER — Encounter: Payer: Self-pay | Admitting: Family Medicine

## 2018-03-23 VITALS — BP 116/76 | HR 75 | Temp 98.1°F | Resp 16 | Ht 69.0 in | Wt 207.8 lb

## 2018-03-23 DIAGNOSIS — K649 Unspecified hemorrhoids: Secondary | ICD-10-CM

## 2018-03-23 DIAGNOSIS — D229 Melanocytic nevi, unspecified: Secondary | ICD-10-CM

## 2018-03-23 LAB — CBC WITH DIFFERENTIAL/PLATELET
ABSOLUTE MONOCYTES: 637 {cells}/uL (ref 200–950)
Basophils Absolute: 55 cells/uL (ref 0–200)
Basophils Relative: 0.6 %
Eosinophils Absolute: 291 cells/uL (ref 15–500)
Eosinophils Relative: 3.2 %
HCT: 41 % (ref 38.5–50.0)
HEMOGLOBIN: 14.4 g/dL (ref 13.2–17.1)
Lymphs Abs: 2339 cells/uL (ref 850–3900)
MCH: 32.4 pg (ref 27.0–33.0)
MCHC: 35.1 g/dL (ref 32.0–36.0)
MCV: 92.1 fL (ref 80.0–100.0)
MPV: 9.7 fL (ref 7.5–12.5)
Monocytes Relative: 7 %
Neutro Abs: 5779 cells/uL (ref 1500–7800)
Neutrophils Relative %: 63.5 %
Platelets: 298 10*3/uL (ref 140–400)
RBC: 4.45 10*6/uL (ref 4.20–5.80)
RDW: 13 % (ref 11.0–15.0)
Total Lymphocyte: 25.7 %
WBC: 9.1 10*3/uL (ref 3.8–10.8)

## 2018-03-23 LAB — POC HEMOCCULT BLD/STL (OFFICE/1-CARD/DIAGNOSTIC)
Card #1 Date: 1032020
Fecal Occult Blood, POC: NEGATIVE

## 2018-03-23 MED ORDER — HYDROCORTISONE ACE-PRAMOXINE 1-1 % RE FOAM: 1.0000 | Freq: Two times a day (BID) | RECTAL | 0 refills | Status: DC

## 2018-03-23 NOTE — Progress Notes (Signed)
Patient ID: Jose [name], male    DOB: 1970/10/01  Age: 48 y.o. MRN: [id number]    Subjective:  Subjective  HPI Jose [name] presents to reestablish and c/o hemorrhoids.  They come and go--- and he c/o rectal itching-- with fluid "leaking"--- pt states it is not blood, but it feels very wet in rectal area.   + itching    Review of Systems  Constitutional: Negative for chills and fever.  HENT: Negative for congestion and hearing loss.   Eyes: Negative for discharge.  Respiratory: Negative for cough and shortness of breath.   Cardiovascular: Negative for chest pain, palpitations and leg swelling.  Gastrointestinal: Positive for rectal pain. Negative for abdominal pain, blood in stool, constipation, diarrhea, nausea and vomiting.  Genitourinary: Negative for dysuria, frequency, hematuria and urgency.  Musculoskeletal: Negative for back pain and myalgias.  Skin: Negative for rash.  Allergic/Immunologic: Negative for environmental allergies.  Neurological: Negative for dizziness, weakness and headaches.  Hematological: Does not bruise/bleed easily.  Psychiatric/Behavioral: Negative for suicidal ideas. The patient is not nervous/anxious.     History Past Medical History:  Diagnosis Date  . Herpes   . Insomnia     He has no past surgical history on file.   His family history is not on file.He reports that he has never smoked. He has never used smokeless tobacco. He reports that he does not drink alcohol or use drugs.  Current Outpatient Medications on File Prior to Visit  Medication Sig Dispense Refill  . valACYclovir (VALTREX) 1000 MG tablet TAKE 1 TABLET(1000 MG) BY MOUTH DAILY 90 tablet 1  . zolpidem (AMBIEN) 5 MG tablet Take 1 tablet (5 mg total) by mouth at bedtime as needed for sleep. 15 tablet 1   No current facility-administered medications on file prior to visit.      Objective:  Objective  Physical Exam Vitals signs and nursing note reviewed. Exam conducted with a  chaperone present.  Constitutional:      General: He is sleeping.     Appearance: He is well-developed.  HENT:     Head: Normocephalic and atraumatic.  Eyes:     Pupils: Pupils are equal, round, and reactive to light.  Neck:     Musculoskeletal: Normal range of motion and neck supple.     Thyroid: No thyromegaly.  Cardiovascular:     Rate and Rhythm: Normal rate and regular rhythm.     Heart sounds: No murmur.  Pulmonary:     Effort: Pulmonary effort is normal. No respiratory distress.     Breath sounds: Normal breath sounds. No wheezing or rales.  Chest:     Chest wall: No tenderness.  Genitourinary:    Rectum: Guaiac result negative. Tenderness present. No mass, anal fissure, external hemorrhoid or internal hemorrhoid. Normal anal tone.  Musculoskeletal:        General: No tenderness.  Skin:    General: Skin is warm and dry.       Neurological:     Mental Status: He is oriented to person, place, and time.  Psychiatric:        Behavior: Behavior normal.        Thought Content: Thought content normal.        Judgment: Judgment normal.    BP 116/76 (BP Location: Right Arm, Cuff Size: Normal)   Pulse 75   Temp 98.1 F (36.7 C) (Oral)   Resp 16   Ht 5\' 9"  (1.753 m)   Wt 207 lb 12.8 oz (  94.3 kg)   SpO2 97%   BMI 30.69 kg/m  Wt Readings from Last 3 Encounters:  03/23/18 207 lb 12.8 oz (94.3 kg)  08/29/14 201 lb 12.8 oz (91.5 kg)  05/29/13 201 lb (91.2 kg)     Lab Results  Component Value Date   WBC 6.0 05/29/2013   HGB 14.7 05/29/2013   HCT 42.9 05/29/2013   PLT 219.0 05/29/2013   GLUCOSE 138 (H) 05/29/2013   CHOL 199 05/29/2013   TRIG 157.0 (H) 05/29/2013   HDL 36.30 (L) 05/29/2013   LDLCALC 131 (H) 05/29/2013   ALT 50 05/29/2013   AST 45 (H) 05/29/2013   NA 138 05/29/2013   K 3.9 05/29/2013   CL 104 05/29/2013   CREATININE 1.1 05/29/2013   BUN 15 05/29/2013   CO2 27 05/29/2013   TSH 2.43 05/29/2013   PSA 0.63 05/29/2013   HGBA1C 5.2 08/04/2011     Dg Chest 2 View  Result Date: 08/05/2011 *RADIOLOGY REPORT* Clinical Data: Leukocytosis and fatigue. CHEST - 2 VIEW Comparison: None. Findings: Heart and mediastinal contours are within normal limits. The lung fields appear clear with no signs of focal infiltrate or congestive failure.  No pleural fluid or significant peribronchial cuffing is noted. Bony structures appear intact. IMPRESSION: No acute or worrisome focal cardiopulmonary abnormality noted Original Report Authenticated By: Ander Gaster, M.D.    Assessment & Plan:  Plan  I have discontinued Dorcas Carrow typhoid, levocetirizine, and montelukast. I am also having him start on hydrocortisone-pramoxine. Additionally, I am having him maintain his zolpidem and valACYclovir.  Meds ordered this encounter  Medications  . hydrocortisone-pramoxine (PROCTOFOAM HC) rectal foam    Sig: Place 1 applicator rectally 2 (two) times daily.    Dispense:  10 g    Refill:  0    Problem List Items Addressed This Visit    None    Visit Diagnoses    Suspicious nevus    -  Primary   Relevant Orders   Ambulatory referral to Dermatology   Hemorrhoids, unspecified hemorrhoid type       Relevant Medications   hydrocortisone-pramoxine (PROCTOFOAM HC) rectal foam   Other Relevant Orders   POC Hemoccult Bld/Stl (1-Cd Office Dx) (Completed)   CBC with Differential/Platelet   Ambulatory referral to Gastroenterology    no hemorrhoids ext today--- heme neg stool Will refer to GI secondary to hx rectal bleeding and "leaking" with hemorrhoids   Follow-up: Return in about 3 months (around 06/22/2018) for fasting, annual exam.  [name], DO

## 2018-03-23 NOTE — Patient Instructions (Signed)

## 2018-03-30 ENCOUNTER — Encounter: Payer: Self-pay | Admitting: Gastroenterology

## 2018-04-16 ENCOUNTER — Encounter: Payer: Self-pay | Admitting: Gastroenterology

## 2018-04-16 ENCOUNTER — Ambulatory Visit (INDEPENDENT_AMBULATORY_CARE_PROVIDER_SITE_OTHER): Payer: BLUE CROSS/BLUE SHIELD | Admitting: Gastroenterology

## 2018-04-16 VITALS — BP 114/70 | HR 87 | Ht 69.0 in | Wt 210.5 lb

## 2018-04-16 DIAGNOSIS — K921 Melena: Secondary | ICD-10-CM | POA: Diagnosis not present

## 2018-04-16 DIAGNOSIS — R1310 Dysphagia, unspecified: Secondary | ICD-10-CM

## 2018-04-16 DIAGNOSIS — L290 Pruritus ani: Secondary | ICD-10-CM

## 2018-04-16 DIAGNOSIS — Z8379 Family history of other diseases of the digestive system: Secondary | ICD-10-CM | POA: Diagnosis not present

## 2018-04-16 MED ORDER — HYDROCORTISONE ACE-PRAMOXINE 1-1 % RE FOAM: 1.0000 | Freq: Two times a day (BID) | RECTAL | 0 refills | Status: DC

## 2018-04-16 NOTE — Progress Notes (Signed)
Chief Complaint: Hemorrhoids, hematochezia  Referring Provider:     Carollee Herter, Alferd Apa, DO   HPI:    Jose [name] is a 48 y.o. male referred to the [facility] for evaluation of symptomatic hemorrhoids.  He endorses rectal itching, fluid leaking and feeling of incomplete cleaning of the perianal area. Itching worse at night and increasingly bothersome. Sxs present 3-4 years, but more symptomatic over last few months. Does have intermittent BRB on tissue paper. Very rare dyschezia more described as discomfort and pain. No abdominal pain. No recent changes in bowel habits.   He was seen by his PCM on 03/23/2018.  No external hemorrhoids noted on exam and FOBT negative.  Was started on Proctofoam HC twice daily and referred to GI for further evaluation.  CBC normal at that time.  Today, he states he has tried increased dietary fiber and increased fluid intake. Sxs improved with these modifications and Proctofoam. Has also started using OTC Preparation H. Sxs improved, but not resolved.   No previous colonoscopy or EGD.  Separately, he endorses intermittent dysphagia to rice. No issue with bread, meats, etc. No prior food impactions or ER evals for this, but has had to regurgitate food back out when not clearing with fluids alone. No recent hx of reflux and does not take any acid suppression medications. No n/v/f/c.  He does have a history of environmental allergies requiring desensitization treatment in the past.  Otherwise no history of asthma.  Sister with Ulcerative Colitis. Otherwise, no FHx of GI malignancy, liver, or pancreatic disease.   Past Medical History:  Diagnosis Date  . Herpes   . Insomnia      No past surgical history on file. No family history on file. Social History   Tobacco Use  . Smoking status: Never Smoker  . Smokeless tobacco: Never Used  Substance Use Topics  . Alcohol use: No  . Drug use: No   Current Outpatient Medications    Medication Sig Dispense Refill  . hydrocortisone-pramoxine (PROCTOFOAM HC) rectal foam Place 1 applicator rectally 2 (two) times daily. 10 g 0  . valACYclovir (VALTREX) 1000 MG tablet TAKE 1 TABLET(1000 MG) BY MOUTH DAILY 90 tablet 1  . zolpidem (AMBIEN) 5 MG tablet Take 1 tablet (5 mg total) by mouth at bedtime as needed for sleep. 15 tablet 1   No current facility-administered medications for this visit.    No Known Allergies   Review of Systems: All systems reviewed and negative except where noted in HPI.     Physical Exam:    Wt Readings from Last 3 Encounters:  04/16/18 210 lb 8 oz (95.5 kg)  03/23/18 207 lb 12.8 oz (94.3 kg)  08/29/14 201 lb 12.8 oz (91.5 kg)    Ht 5\' 9"  (1.753 m)   Wt 210 lb 8 oz (95.5 kg)   BMI 31.09 kg/m  Constitutional:  Pleasant, in no acute distress. Psychiatric: Normal mood and affect. Behavior is normal. EENT: Pupils normal.  Conjunctivae are normal. No scleral icterus. Neck supple. No cervical LAD. Cardiovascular: Normal rate, regular rhythm. No edema Pulmonary/chest: Effort normal and breath sounds normal. No wheezing, rales or rhonchi. Abdominal: Soft, nondistended, nontender. Bowel sounds active throughout. There are no masses palpable. No hepatomegaly. Neurological: Alert and oriented to person place and time. Skin: Skin is warm and dry. No rashes noted. Rectal: Exam deferred by patient given recent exam by PCM and improved sxs.  ASSESSMENT AND PLAN;   Jose [name] is a 48 y.o. male presenting with:  1) Rectal pruritis:  Clinical presentation seems most consistent with hemorrhoids.  No external hemorrhoids noted on previous rectal exam.  Discussed ongoing medical management along with further evaluation for internal hemorrhoids and alternate etiology with colonoscopy.  Ultimately, he declined colonoscopy in favor of conservative management alone at this time.  - Resume high-fiber diet and fiber supplement and increase fluid intake  for goal of regular soft stools without straining to have BM - Discussed use for topical therapy such as Preparation H, and limit to no more than 10-14 days at a time - Recommended sitz bath -Recommended witch hazel wipes -Discussed treatment of hemorrhoids with hemorrhoid band ligation.  He would like to hold off on this at this time - If symptoms ongoing for decides he would like to evaluate for more proximal etiology, can RTC or call to schedule colonoscopy.  Similarly, if he would like to proceed with hemorrhoid band ligation, it is my recommendation to proceed with colonoscopy first to evaluate for degree/severity of hemorrhoidal disease and rule out concomitant anal rectal disease -Provided with Rx for Proctofoam to use as needed in the future  2) Dysphagia: Clinical history seems suggestive of EOE (intermittent dysphasia, history of environmental allergies, and no history of reflux), but also discussed alternate etiologies for intermittent dysphagia.  Offered EGD for diagnostic and potentially therapeutic intent which he politely declined at this time.  - Continue to cut food into small pieces and chew thoroughly with plenty of fluids - If symptoms persist or would like further evaluation, can call to set up for EGD with possible dilation and biopsies  3) Family history of Ulcerative Colitis: Sister with history of UC.  Did discuss possible atypical presentations of IBD and high diagnostic utility of colonoscopy.  As above, he declined colonoscopy at this time.  RTC PRN  I spent a total of 45 minutes of face-to-face time with the patient. Greater than 50% of the time was spent counseling and coordinating care.    Lavena Bullion, DO, FACG  04/16/2018, 8:33 AM   Carollee Herter, Alferd Apa, *

## 2018-04-16 NOTE — Patient Instructions (Addendum)
If you are age 47 or older, your body mass index should be between 23-30. Your Body mass index is 31.09 kg/m. If this is out of the aforementioned range listed, please consider follow up with your Primary Care Provider.  If you are age 39 or younger, your body mass index should be between 19-25. Your Body mass index is 31.09 kg/m. If this is out of the aformentioned range listed, please consider follow up with your Primary Care Provider.   We have sent the following medications to your pharmacy for you to pick up at your convenience: Proctofoam twice daily.  Please start taking citrucel (orange flavored) powder fiber supplement.  This may cause some bloating at first but that usually goes away. Begin with a small spoonful and work your way up to a large, heaping spoonful daily over a week.  It was a pleasure to see you today!  Vito Cirigliano, D.O.

## 2018-07-17 ENCOUNTER — Encounter: Payer: BLUE CROSS/BLUE SHIELD | Admitting: Family Medicine

## 2018-08-08 DIAGNOSIS — L500 Allergic urticaria: Secondary | ICD-10-CM | POA: Diagnosis not present

## 2018-11-29 ENCOUNTER — Other Ambulatory Visit: Payer: Self-pay | Admitting: Family Medicine

## 2018-11-29 ENCOUNTER — Other Ambulatory Visit: Payer: Self-pay

## 2018-11-29 ENCOUNTER — Ambulatory Visit (INDEPENDENT_AMBULATORY_CARE_PROVIDER_SITE_OTHER): Payer: BC Managed Care – PPO | Admitting: Family Medicine

## 2018-11-29 ENCOUNTER — Encounter: Payer: Self-pay | Admitting: Family Medicine

## 2018-11-29 VITALS — BP 118/70 | HR 82 | Temp 97.6°F | Resp 18 | Ht 69.0 in | Wt 206.0 lb

## 2018-11-29 DIAGNOSIS — H538 Other visual disturbances: Secondary | ICD-10-CM

## 2018-11-29 DIAGNOSIS — Z23 Encounter for immunization: Secondary | ICD-10-CM

## 2018-11-29 DIAGNOSIS — A6001 Herpesviral infection of penis: Secondary | ICD-10-CM | POA: Diagnosis not present

## 2018-11-29 DIAGNOSIS — Z0000 Encounter for general adult medical examination without abnormal findings: Secondary | ICD-10-CM | POA: Diagnosis not present

## 2018-11-29 DIAGNOSIS — Z125 Encounter for screening for malignant neoplasm of prostate: Secondary | ICD-10-CM | POA: Diagnosis not present

## 2018-11-29 DIAGNOSIS — R739 Hyperglycemia, unspecified: Secondary | ICD-10-CM

## 2018-11-29 LAB — CBC WITH DIFFERENTIAL/PLATELET
Basophils Absolute: 0 10*3/uL (ref 0.0–0.1)
Basophils Relative: 0.5 % (ref 0.0–3.0)
Eosinophils Absolute: 0.2 10*3/uL (ref 0.0–0.7)
Eosinophils Relative: 3.3 % (ref 0.0–5.0)
HCT: 43.6 % (ref 39.0–52.0)
Hemoglobin: 15.4 g/dL (ref 13.0–17.0)
Lymphocytes Relative: 28 % (ref 12.0–46.0)
Lymphs Abs: 1.9 10*3/uL (ref 0.7–4.0)
MCHC: 35.3 g/dL (ref 30.0–36.0)
MCV: 92.1 fl (ref 78.0–100.0)
Monocytes Absolute: 0.6 10*3/uL (ref 0.1–1.0)
Monocytes Relative: 8.8 % (ref 3.0–12.0)
Neutro Abs: 4.1 10*3/uL (ref 1.4–7.7)
Neutrophils Relative %: 59.4 % (ref 43.0–77.0)
Platelets: 255 10*3/uL (ref 150.0–400.0)
RBC: 4.73 Mil/uL (ref 4.22–5.81)
RDW: 13.4 % (ref 11.5–15.5)
WBC: 7 10*3/uL (ref 4.0–10.5)

## 2018-11-29 LAB — COMPREHENSIVE METABOLIC PANEL
ALT: 61 U/L — ABNORMAL HIGH (ref 0–53)
AST: 34 U/L (ref 0–37)
Albumin: 4.7 g/dL (ref 3.5–5.2)
Alkaline Phosphatase: 52 U/L (ref 39–117)
BUN: 16 mg/dL (ref 6–23)
CO2: 28 mEq/L (ref 19–32)
Calcium: 9.5 mg/dL (ref 8.4–10.5)
Chloride: 102 mEq/L (ref 96–112)
Creatinine, Ser: 1.05 mg/dL (ref 0.40–1.50)
GFR: 75.75 mL/min (ref >60.00)
Glucose, Bld: 180 mg/dL — ABNORMAL HIGH (ref 70–99)
Potassium: 4.2 mEq/L (ref 3.5–5.1)
Sodium: 136 mEq/L (ref 135–145)
Total Bilirubin: 0.9 mg/dL (ref 0.2–1.2)
Total Protein: 7.1 g/dL (ref 6.0–8.3)

## 2018-11-29 LAB — LIPID PANEL
Cholesterol: 216 mg/dL — ABNORMAL HIGH (ref 0–200)
HDL: 38.1 mg/dL — ABNORMAL LOW (ref >39.00)
LDL Cholesterol: 139 mg/dL — ABNORMAL HIGH (ref 0–99)
NonHDL: 178.29
Total CHOL/HDL Ratio: 6
Triglycerides: 198 mg/dL — ABNORMAL HIGH (ref 0.0–149.0)
VLDL: 39.6 mg/dL (ref 0.0–40.0)

## 2018-11-29 LAB — PSA: PSA: 0.87 ng/mL (ref 0.10–4.00)

## 2018-11-29 LAB — TSH: TSH: 2.34 u[IU]/mL (ref 0.35–4.50)

## 2018-11-29 MED ORDER — VALACYCLOVIR HCL 1 G PO TABS: ORAL_TABLET | ORAL | 1 refills | Status: DC

## 2018-11-29 NOTE — Assessment & Plan Note (Signed)
ghm utd Check labs  See avs  

## 2018-11-29 NOTE — Progress Notes (Signed)
Patient ID: Jose [name], male    DOB: August 22, 1970  Age: 48 y.o. MRN: [id number]    Subjective:  Subjective  HPI Jose [name] presents for cpe.    No complaints.  He later c/o some aches and pains but did not want to do anything about them. He also c/o trouble reading due to blurry vision and would like to see a opth.   He needs refills of some meds as well. Review of Systems  Constitutional: Negative.   HENT: Negative for congestion, ear pain, hearing loss, nosebleeds, postnasal drip, rhinorrhea, sinus pressure, sneezing and tinnitus.   Eyes: Negative for photophobia, discharge, itching and visual disturbance.  Respiratory: Negative.   Cardiovascular: Negative.   Gastrointestinal: Negative for abdominal distention, abdominal pain, anal bleeding, blood in stool and constipation.  Endocrine: Negative.   Genitourinary: Negative.   Musculoskeletal: Positive for neck pain and neck stiffness.  Skin: Negative.   Allergic/Immunologic: Negative.   Neurological: Positive for headaches. Negative for dizziness, weakness, light-headedness and numbness.  Psychiatric/Behavioral: Negative for agitation, confusion, decreased concentration, dysphoric mood, sleep disturbance and suicidal ideas. The patient is not nervous/anxious.     History Past Medical History:  Diagnosis Date  . Herpes   . Insomnia     He has no past surgical history on file.   His family history is not on file.He reports that he has never smoked. He has never used smokeless tobacco. He reports current alcohol use. He reports that he does not use drugs.  Current Outpatient Medications on File Prior to Visit  Medication Sig Dispense Refill  . zolpidem (AMBIEN) 5 MG tablet Take 1 tablet (5 mg total) by mouth at bedtime as needed for sleep. (Patient not taking: Reported on 04/16/2018) 15 tablet 1   No current facility-administered medications on file prior to visit.      Objective:  Objective  Physical Exam Vitals signs  and nursing note reviewed.  Constitutional:      General: He is not in acute distress.    Appearance: He is well-developed. He is not diaphoretic.  HENT:     Head: Normocephalic and atraumatic.     Right Ear: External ear normal.     Left Ear: External ear normal.     Nose: Nose normal.     Mouth/Throat:     Pharynx: No oropharyngeal exudate.  Eyes:     General:        Right eye: No discharge.        Left eye: No discharge.     Conjunctiva/sclera: Conjunctivae normal.     Pupils: Pupils are equal, round, and reactive to light.  Neck:     Musculoskeletal: Normal range of motion and neck supple.     Thyroid: No thyromegaly.     Vascular: No JVD.  Cardiovascular:     Rate and Rhythm: Normal rate and regular rhythm.     Heart sounds: Normal heart sounds. No murmur. No friction rub. No gallop.   Pulmonary:     Effort: Pulmonary effort is normal. No respiratory distress.     Breath sounds: Normal breath sounds. No wheezing or rales.  Chest:     Chest wall: No tenderness.  Abdominal:     General: Bowel sounds are normal. There is no distension.     Palpations: Abdomen is soft. There is no mass.     Tenderness: There is no abdominal tenderness. There is no guarding or rebound.  Genitourinary:    Comments: Pt refused ---  done in Jan Musculoskeletal: Normal range of motion.        General: No tenderness.  Lymphadenopathy:     Cervical: No cervical adenopathy.  Skin:    General: Skin is warm and dry.     Coloration: Skin is not pale.     Findings: No erythema or rash.  Neurological:     Mental Status: He is alert and oriented to person, place, and time.     Motor: No abnormal muscle tone.     Deep Tendon Reflexes: Reflexes are normal and symmetric. Reflexes normal.  Psychiatric:        Behavior: Behavior normal.        Thought Content: Thought content normal.        Judgment: Judgment normal.    BP 118/70 (BP Location: Right Arm, Patient Position: Sitting, Cuff Size: Normal)    Pulse 82   Temp 97.6 F (36.4 C) (Temporal)   Resp 18   Ht 5\' 9"  (1.753 m)   Wt 206 lb (93.4 kg)   SpO2 96%   BMI 30.42 kg/m  Wt Readings from Last 3 Encounters:  11/29/18 206 lb (93.4 kg)  04/16/18 210 lb 8 oz (95.5 kg)  03/23/18 207 lb 12.8 oz (94.3 kg)     Lab Results  Component Value Date   WBC 7.0 11/29/2018   HGB 15.4 11/29/2018   HCT 43.6 11/29/2018   PLT 255.0 11/29/2018   GLUCOSE 180 (H) 11/29/2018   CHOL 216 (H) 11/29/2018   TRIG 198.0 (H) 11/29/2018   HDL 38.10 (L) 11/29/2018   LDLCALC 139 (H) 11/29/2018   ALT 61 (H) 11/29/2018   AST 34 11/29/2018   NA 136 11/29/2018   K 4.2 11/29/2018   CL 102 11/29/2018   CREATININE 1.05 11/29/2018   BUN 16 11/29/2018   CO2 28 11/29/2018   TSH 2.34 11/29/2018   PSA 0.87 11/29/2018   HGBA1C 5.2 08/04/2011    Dg Chest 2 View  Result Date: 08/05/2011 *RADIOLOGY REPORT* Clinical Data: Leukocytosis and fatigue. CHEST - 2 VIEW Comparison: None. Findings: Heart and mediastinal contours are within normal limits. The lung fields appear clear with no signs of focal infiltrate or congestive failure.  No pleural fluid or significant peribronchial cuffing is noted. Bony structures appear intact. IMPRESSION: No acute or worrisome focal cardiopulmonary abnormality noted Original Report Authenticated By: Ander Gaster, M.D.    Assessment & Plan:  Plan  I have discontinued Dorcas Carrow hydrocortisone-pramoxine. I am also having him maintain his zolpidem and valACYclovir.  Meds ordered this encounter  Medications  . valACYclovir (VALTREX) 1000 MG tablet    Sig: TAKE 1 TABLET(1000 MG) BY MOUTH DAILY    Dispense:  90 tablet    Refill:  1    Problem List Items Addressed This Visit      Unprioritized   Preventative health care - Primary    ghm utd Check labs  See avs       Relevant Orders   TSH (Completed)   PSA (Completed)   CBC with Differential/Platelet (Completed)   Lipid panel (Completed)   Comprehensive  metabolic panel (Completed)    Other Visit Diagnoses    Need for influenza vaccination       Relevant Orders   Flu Vaccine QUAD 6+ mos PF IM (Fluarix Quad PF) (Completed)   Herpes simplex infection of penis       Relevant Medications   valACYclovir (VALTREX) 1000 MG tablet   Blurry vision, bilateral  Relevant Orders   Ambulatory referral to Ophthalmology   Need for Tdap vaccination       Relevant Orders   Tdap vaccine greater than or equal to 7yo IM (Completed)      Follow-up: Return in about 1 year (around 11/29/2019), or if symptoms worsen or fail to improve, for annual exam, fasting.  [name], DO

## 2018-11-29 NOTE — Patient Instructions (Signed)
Preventive Care 81-47 Years Old, Male Preventive care refers to lifestyle choices and visits with your health care provider that can promote health and wellness. This includes:  A yearly physical exam. This is also called an annual well check.  Regular dental and eye exams.  Immunizations.  Screening for certain conditions.  Healthy lifestyle choices, such as eating a healthy diet, getting regular exercise, not using drugs or products that contain nicotine and tobacco, and limiting alcohol use. What can I expect for my preventive care visit? Physical exam Your health care provider will check:  Height and weight. These may be used to calculate body mass index (BMI), which is a measurement that tells if you are at a healthy weight.  Heart rate and blood pressure.  Your skin for abnormal spots. Counseling Your health care provider may ask you questions about:  Alcohol, tobacco, and drug use.  Emotional well-being.  Home and relationship well-being.  Sexual activity.  Eating habits.  Work and work Statistician. What immunizations do I need?  Influenza (flu) vaccine  This is recommended every year. Tetanus, diphtheria, and pertussis (Tdap) vaccine  You may need a Td booster every 10 years. Varicella (chickenpox) vaccine  You may need this vaccine if you have not already been vaccinated. Zoster (shingles) vaccine  You may need this after age 51. Measles, mumps, and rubella (MMR) vaccine  You may need at least one dose of MMR if you were born in 1957 or later. You may also need a second dose. Pneumococcal conjugate (PCV13) vaccine  You may need this if you have certain conditions and were not previously vaccinated. Pneumococcal polysaccharide (PPSV23) vaccine  You may need one or two doses if you smoke cigarettes or if you have certain conditions. Meningococcal conjugate (MenACWY) vaccine  You may need this if you have certain conditions. Hepatitis A vaccine   You may need this if you have certain conditions or if you travel or work in places where you may be exposed to hepatitis A. Hepatitis B vaccine  You may need this if you have certain conditions or if you travel or work in places where you may be exposed to hepatitis B. Haemophilus influenzae type b (Hib) vaccine  You may need this if you have certain risk factors. Human papillomavirus (HPV) vaccine  If recommended by your health care provider, you may need three doses over 6 months. You may receive vaccines as individual doses or as more than one vaccine together in one shot (combination vaccines). Talk with your health care provider about the risks and benefits of combination vaccines. What tests do I need? Blood tests  Lipid and cholesterol levels. These may be checked every 5 years, or more frequently if you are over 54 years old.  Hepatitis C test.  Hepatitis B test. Screening  Lung cancer screening. You may have this screening every year starting at age 74 if you have a 30-pack-year history of smoking and currently smoke or have quit within the past 15 years.  Prostate cancer screening. Recommendations will vary depending on your family history and other risks.  Colorectal cancer screening. All adults should have this screening starting at age 47 and continuing until age 38. Your health care provider may recommend screening at age 35 if you are at increased risk. You will have tests every 1-10 years, depending on your results and the type of screening test.  Diabetes screening. This is done by checking your blood sugar (glucose) after you have not eaten  for a while (fasting). You may have this done every 1-3 years.  Sexually transmitted disease (STD) testing. Follow these instructions at home: Eating and drinking  Eat a diet that includes fresh fruits and vegetables, whole grains, lean protein, and low-fat dairy products.  Take vitamin and mineral supplements as recommended  by your health care provider.  Do not drink alcohol if your health care provider tells you not to drink.  If you drink alcohol: ? Limit how much you have to 0-2 drinks a day. ? Be aware of how much alcohol is in your drink. In the [place] one drink equals one 12 oz bottle of beer (355 mL), one 5 oz glass of wine (148 mL), or one 1 oz glass of hard liquor (44 mL). Lifestyle  Take daily care of your teeth and gums.  Stay active. Exercise for at least 30 minutes on 5 or more days each week.  Do not use any products that contain nicotine or tobacco, such as cigarettes, e-cigarettes, and chewing tobacco. If you need help quitting, ask your health care provider.  If you are sexually active, practice safe sex. Use a condom or other form of protection to prevent STIs (sexually transmitted infections).  Talk with your health care provider about taking a low-dose aspirin every day starting at age 1. What's next?  Go to your health care provider once a year for a well check visit.  Ask your health care provider how often you should have your eyes and teeth checked.  Stay up to date on all vaccines. This information is not intended to replace advice given to you by your health care provider. Make sure you discuss any questions you have with your health care provider. Document Released: 04/03/2015 Document Revised: 03/01/2018 Document Reviewed: 03/01/2018 Elsevier Patient Education  2020 [employer].

## 2019-01-07 ENCOUNTER — Encounter: Payer: Self-pay | Admitting: Family Medicine

## 2019-01-07 NOTE — Telephone Encounter (Signed)
We certainly can recheck them fasting but it should no make that huge of a difference

## 2019-03-27 DIAGNOSIS — M545 Low back pain: Secondary | ICD-10-CM | POA: Diagnosis not present

## 2019-03-27 DIAGNOSIS — M25551 Pain in right hip: Secondary | ICD-10-CM | POA: Diagnosis not present

## 2019-07-09 DIAGNOSIS — F909 Attention-deficit hyperactivity disorder, unspecified type: Secondary | ICD-10-CM | POA: Diagnosis not present

## 2019-07-12 DIAGNOSIS — F909 Attention-deficit hyperactivity disorder, unspecified type: Secondary | ICD-10-CM | POA: Diagnosis not present

## 2019-09-24 ENCOUNTER — Ambulatory Visit (INDEPENDENT_AMBULATORY_CARE_PROVIDER_SITE_OTHER): Payer: BC Managed Care – PPO | Admitting: Family Medicine

## 2019-09-24 ENCOUNTER — Other Ambulatory Visit: Payer: Self-pay

## 2019-09-24 ENCOUNTER — Encounter: Payer: Self-pay | Admitting: Family Medicine

## 2019-09-24 VITALS — BP 124/80 | HR 64 | Temp 98.0°F | Ht 69.0 in | Wt 181.5 lb

## 2019-09-24 DIAGNOSIS — S46812A Strain of other muscles, fascia and tendons at shoulder and upper arm level, left arm, initial encounter: Secondary | ICD-10-CM | POA: Diagnosis not present

## 2019-09-24 DIAGNOSIS — M542 Cervicalgia: Secondary | ICD-10-CM

## 2019-09-24 NOTE — Patient Instructions (Signed)
Heat (pad or rice pillow in microwave) over affected area, 10-15 minutes twice daily.   OK to take Tylenol 1000 mg (2 extra strength tabs) or 975 mg (3 regular strength tabs) every 6 hours as needed.  Ibuprofen 400-600 mg (2-3 over the counter strength tabs) every 6 hours as needed for pain.  Trapezius stretches/exercises Do exercises exactly as told by your health care provider and adjust them as directed. It is normal to feel mild stretching, pulling, tightness, or discomfort as you do these exercises, but you should stop right away if you feel sudden pain or your pain gets worse.  Stretching and range of motion exercises These exercises warm up your muscles and joints and improve the movement and flexibility of your shoulder. These exercises can also help to relieve pain, numbness, and tingling. If you are unable to do any of the following for any reason, do not further attempt to do it.   Exercise A: Flexion, standing    1. Stand and hold a broomstick, a cane, or a similar object. Place your hands a little more than shoulder-width apart on the object. Your left / right hand should be palm-up, and your other hand should be palm-down. 2. Push the stick to raise your left / right arm out to your side and then over your head. Use your other hand to help move the stick. Stop when you feel a stretch in your shoulder, or when you reach the angle that is recommended by your health care provider. ? Avoid shrugging your shoulder while you raise your arm. Keep your shoulder blade tucked down toward your spine. 3. Hold for 30 seconds. 4. Slowly return to the starting position. Repeat 2 times. Complete this exercise 3 times per week.  Exercise B: Abduction, supine    1. Lie on your back and hold a broomstick, a cane, or a similar object. Place your hands a little more than shoulder-width apart on the object. Your left / right hand should be palm-up, and your other hand should be  palm-down. 2. Push the stick to raise your left / right arm out to your side and then over your head. Use your other hand to help move the stick. Stop when you feel a stretch in your shoulder, or when you reach the angle that is recommended by your health care provider. ? Avoid shrugging your shoulder while you raise your arm. Keep your shoulder blade tucked down toward your spine. 3. Hold for 30 seconds. 4. Slowly return to the starting position. Repeat 2 times. Complete this exercise 3 times per week.  Exercise C: Flexion, active-assisted    1. Lie on your back. You may bend your knees for comfort. 2. Hold a broomstick, a cane, or a similar object. Place your hands about shoulder-width apart on the object. Your palms should face toward your feet. 3. Raise the stick and move your arms over your head and behind your head, toward the floor. Use your healthy arm to help your left / right arm move farther. Stop when you feel a gentle stretch in your shoulder, or when you reach the angle where your health care provider tells you to stop. 4. Hold for 30 seconds. 5. Slowly return to the starting position. Repeat 2 times. Complete this exercise 3 times per week.  Exercise D: External rotation and abduction    1. Stand in a door frame with one of your feet slightly in front of the other. This is called a  staggered stance. 2. Choose one of the following positions as told by your health care provider: ? Place your hands and forearms on the door frame above your head. ? Place your hands and forearms on the door frame at the height of your head. ? Place your hands on the door frame at the height of your elbows. 3. Slowly move your weight onto your front foot until you feel a stretch across your chest and in the front of your shoulders. Keep your head and chest upright and keep your abdominal muscles tight. 4. Hold for 30 seconds. 5. To release the stretch, shift your weight to your back  foot. Repeat 2 times. Complete this stretch 3 times per week.  Strengthening exercises These exercises build strength and endurance in your shoulder. Endurance is the ability to use your muscles for a long time, even after your muscles get tired. Exercise E: Scapular depression and adduction  1. Sit on a stable chair. Support your arms in front of you with pillows, armrests, or a tabletop. Keep your elbows in line with the sides of your body. 2. Gently move your shoulder blades down toward your middle back. Relax the muscles on the tops of your shoulders and in the back of your neck. 3. Hold for 3 seconds. 4. Slowly release the tension and relax your muscles completely before doing this exercise again. Repeat for a total of 10 repetitions. 5. After you have practiced this exercise, try doing the exercise without the arm support. Then, try the exercise while standing instead of sitting. Repeat 2 times. Complete this exercise 3 times per week.  Exercise F: Shoulder abduction, isometric    1. Stand or sit about 4-6 inches (10-15 cm) from a wall with your left / right side facing the wall. 2. Bend your left / right elbow and gently press your elbow against the wall. 3. Increase the pressure slowly until you are pressing as hard as you can without shrugging your shoulder. 4. Hold for 3 seconds. 5. Slowly release the tension and relax your muscles completely. Repeat for a total of 10 repetitions. Repeat 2 times. Complete this exercise 3 times per week.  Exercise G: Shoulder flexion, isometric    1. Stand or sit about 4-6 inches (10-15 cm) away from a wall with your left / right side facing the wall. 2. Keep your left / right elbow straight and gently press the top of your fist against the wall. Increase the pressure slowly until you are pressing as hard as you can without shrugging your shoulder. 3. Hold for 10-15 seconds. 4. Slowly release the tension and relax your muscles completely.  Repeat for a total of 10 repetitions. Repeat 2 times. Complete this exercise 3 times per week.  Exercise H: Internal rotation    1. Sit in a stable chair without armrests, or stand. Secure an exercise band at your left / right side, at elbow height. 2. Place a soft object, such as a folded towel or a small pillow, under your left / right upper arm so your elbow is a few inches (about 8 cm) away from your side. 3. Hold the end of the exercise band so the band stretches. 4. Keeping your elbow pressed against the soft object under your arm, move your forearm across your body toward your abdomen. Keep your body steady so the movement is only coming from your shoulder. 5. Hold for 3 seconds. 6. Slowly return to the starting position. Repeat for a  total of 10 repetitions. Repeat 2 times. Complete this exercise 3 times per week.  Exercise I: External rotation    1. Sit in a stable chair without armrests, or stand. 2. Secure an exercise band at your left / right side, at elbow height. 3. Place a soft object, such as a folded towel or a small pillow, under your left / right upper arm so your elbow is a few inches (about 8 cm) away from your side. 4. Hold the end of the exercise band so the band stretches. 5. Keeping your elbow pressed against the soft object under your arm, move your forearm out, away from your abdomen. Keep your body steady so the movement is only coming from your shoulder. 6. Hold for 3 seconds. 7. Slowly return to the starting position. Repeat for a total of 10 repetitions. Repeat 2 times. Complete this exercise 3 times per week. Exercise J: Shoulder extension  1. Sit in a stable chair without armrests, or stand. Secure an exercise band to a stable object in front of you so the band is at shoulder height. 2. Hold one end of the exercise band in each hand. Your palms should face each other. 3. Straighten your elbows and lift your hands up to shoulder height. 4. Step back,  away from the secured end of the exercise band, until the band stretches. 5. Squeeze your shoulder blades together and pull your hands down to the sides of your thighs. Stop when your hands are straight down by your sides. Do not let your hands go behind your body. 6. Hold for 3 seconds. 7. Slowly return to the starting position. Repeat for a total of 10 repetitions. Repeat 2 times. Complete this exercise 3 times per week.  Exercise K: Shoulder extension, prone    1. Lie on your abdomen on a firm surface so your left / right arm hangs over the edge. 2. Hold a 5 lb weight in your hand so your palm faces in toward your body. Your arm should be straight. 3. Squeeze your shoulder blade down toward the middle of your back. 4. Slowly raise your arm behind you, up to the height of the surface that you are lying on. Keep your arm straight. 5. Hold for 3 seconds. 6. Slowly return to the starting position and relax your muscles. Repeat for a total of 10 repetitions. Repeat 2 times. Complete this exercise 3 times per week.   Exercise L: Horizontal abduction, prone  1. Lie on your abdomen on a firm surface so your left / right arm hangs over the edge. 2. Hold a 5 lb weight in your hand so your palm faces toward your feet. Your arm should be straight. 3. Squeeze your shoulder blade down toward the middle of your back. 4. Bend your elbow so your hand moves up, until your elbow is bent to an "L" shape (90 degrees). With your elbow bent, slowly move your forearm forward and up. Raise your hand up to the height of the surface that you are lying on. ? Your upper arm should not move, and your elbow should stay bent. ? At the top of the movement, your palm should face the floor. 5. Hold for 3 seconds. 6. Slowly return to the starting position and relax your muscles. Repeat for a total of 10 repetitions. Repeat 2 times. Complete this exercise 3 times per week.  Exercise M: Horizontal abduction, standing   1. Sit on a stable chair, or stand. 2.  Secure an exercise band to a stable object in front of you so the band is at shoulder height. 3. Hold one end of the exercise band in each hand. 4. Straighten your elbows and lift your hands straight in front of you, up to shoulder height. Your palms should face down, toward the floor. 5. Step back, away from the secured end of the exercise band, until the band stretches. 6. Move your arms out to your sides, and keep your arms straight. 7. Hold for 3 seconds. 8. Slowly return to the starting position. Repeat for a total of 10 repetitions. Repeat 2 times. Complete this exercise 3 times per week.  Exercise N: Scapular retraction and elevation  1. Sit on a stable chair, or stand. 2. Secure an exercise band to a stable object in front of you so the band is at shoulder height. 3. Hold one end of the exercise band in each hand. Your palms should face each other. 4. Sit in a stable chair without armrests, or stand. 5. Step back, away from the secured end of the exercise band, until the band stretches. 6. Squeeze your shoulder blades together and lift your hands over your head. Keep your elbows straight. 7. Hold for 3 seconds. 8. Slowly return to the starting position. Repeat for a total of 10 repetitions. Repeat 2 times. Complete this exercise 3 times per week.  This information is not intended to replace advice given to you by your health care provider. Make sure you discuss any questions you have with your health care provider. Document Released: 03/07/2005 Document Revised: 11/12/2015 Document Reviewed: 01/22/2015 Elsevier Interactive Patient Education  2017 [employer].
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
## 2019-09-27 ENCOUNTER — Telehealth: Payer: Self-pay | Admitting: Family Medicine
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
## 2019-10-12 ENCOUNTER — Other Ambulatory Visit: Payer: BC Managed Care – PPO

## 2019-10-13 ENCOUNTER — Other Ambulatory Visit: Payer: BC Managed Care – PPO

## 2019-10-14 ENCOUNTER — Ambulatory Visit (INDEPENDENT_AMBULATORY_CARE_PROVIDER_SITE_OTHER): Payer: BC Managed Care – PPO

## 2019-10-14 ENCOUNTER — Other Ambulatory Visit: Payer: Self-pay

## 2019-10-14 DIAGNOSIS — M542 Cervicalgia: Secondary | ICD-10-CM

## 2019-12-05 ENCOUNTER — Encounter: Payer: Self-pay | Admitting: Family Medicine

## 2019-12-05 ENCOUNTER — Ambulatory Visit (INDEPENDENT_AMBULATORY_CARE_PROVIDER_SITE_OTHER): Payer: BC Managed Care – PPO | Admitting: Family Medicine

## 2019-12-05 ENCOUNTER — Other Ambulatory Visit: Payer: Self-pay

## 2019-12-05 VITALS — BP 100/70 | HR 66 | Temp 97.7°F | Resp 18 | Ht 69.0 in | Wt 182.8 lb

## 2019-12-05 DIAGNOSIS — Z23 Encounter for immunization: Secondary | ICD-10-CM | POA: Diagnosis not present

## 2019-12-05 DIAGNOSIS — E785 Hyperlipidemia, unspecified: Secondary | ICD-10-CM | POA: Diagnosis not present

## 2019-12-05 DIAGNOSIS — Z0000 Encounter for general adult medical examination without abnormal findings: Secondary | ICD-10-CM | POA: Diagnosis not present

## 2019-12-05 DIAGNOSIS — R7309 Other abnormal glucose: Secondary | ICD-10-CM | POA: Diagnosis not present

## 2019-12-05 DIAGNOSIS — A6001 Herpesviral infection of penis: Secondary | ICD-10-CM

## 2019-12-05 DIAGNOSIS — R739 Hyperglycemia, unspecified: Secondary | ICD-10-CM | POA: Diagnosis not present

## 2019-12-05 MED ORDER — VALACYCLOVIR HCL 1 G PO TABS: ORAL_TABLET | ORAL | 1 refills | Status: DC

## 2019-12-05 NOTE — Assessment & Plan Note (Signed)
Encouraged heart healthy diet, increase exercise, avoid trans fats, consider a krill oil cap daily 

## 2019-12-05 NOTE — Patient Instructions (Signed)
Preventive Care 41-48 Years Old, Male Preventive care refers to lifestyle choices and visits with your health care provider that can promote health and wellness. This includes:  A yearly physical exam. This is also called an annual well check.  Regular dental and eye exams.  Immunizations.  Screening for certain conditions.  Healthy lifestyle choices, such as eating a healthy diet, getting regular exercise, not using drugs or products that contain nicotine and tobacco, and limiting alcohol use. What can I expect for my preventive care visit? Physical exam Your health care provider will check:  Height and weight. These may be used to calculate body mass index (BMI), which is a measurement that tells if you are at a healthy weight.  Heart rate and blood pressure.  Your skin for abnormal spots. Counseling Your health care provider may ask you questions about:  Alcohol, tobacco, and drug use.  Emotional well-being.  Home and relationship well-being.  Sexual activity.  Eating habits.  Work and work Statistician. What immunizations do I need?  Influenza (flu) vaccine  This is recommended every year. Tetanus, diphtheria, and pertussis (Tdap) vaccine  You may need a Td booster every 10 years. Varicella (chickenpox) vaccine  You may need this vaccine if you have not already been vaccinated. Zoster (shingles) vaccine  You may need this after age 64. Measles, mumps, and rubella (MMR) vaccine  You may need at least one dose of MMR if you were born in 1957 or later. You may also need a second dose. Pneumococcal conjugate (PCV13) vaccine  You may need this if you have certain conditions and were not previously vaccinated. Pneumococcal polysaccharide (PPSV23) vaccine  You may need one or two doses if you smoke cigarettes or if you have certain conditions. Meningococcal conjugate (MenACWY) vaccine  You may need this if you have certain conditions. Hepatitis A  vaccine  You may need this if you have certain conditions or if you travel or work in places where you may be exposed to hepatitis A. Hepatitis B vaccine  You may need this if you have certain conditions or if you travel or work in places where you may be exposed to hepatitis B. Haemophilus influenzae type b (Hib) vaccine  You may need this if you have certain risk factors. Human papillomavirus (HPV) vaccine  If recommended by your health care provider, you may need three doses over 6 months. You may receive vaccines as individual doses or as more than one vaccine together in one shot (combination vaccines). Talk with your health care provider about the risks and benefits of combination vaccines. What tests do I need? Blood tests  Lipid and cholesterol levels. These may be checked every 5 years, or more frequently if you are over 60 years old.  Hepatitis C test.  Hepatitis B test. Screening  Lung cancer screening. You may have this screening every year starting at age 43 if you have a 30-pack-year history of smoking and currently smoke or have quit within the past 15 years.  Prostate cancer screening. Recommendations will vary depending on your family history and other risks.  Colorectal cancer screening. All adults should have this screening starting at age 72 and continuing until age 2. Your health care provider may recommend screening at age 14 if you are at increased risk. You will have tests every 1-10 years, depending on your results and the type of screening test.  Diabetes screening. This is done by checking your blood sugar (glucose) after you have not eaten  for a while (fasting). You may have this done every 1-3 years.  Sexually transmitted disease (STD) testing. Follow these instructions at home: Eating and drinking  Eat a diet that includes fresh fruits and vegetables, whole grains, lean protein, and low-fat dairy products.  Take vitamin and mineral supplements as  recommended by your health care provider.  Do not drink alcohol if your health care provider tells you not to drink.  If you drink alcohol: ? Limit how much you have to 0-2 drinks a day. ? Be aware of how much alcohol is in your drink. In the [place] one drink equals one 12 oz bottle of beer (355 mL), one 5 oz glass of wine (148 mL), or one 1 oz glass of hard liquor (44 mL). Lifestyle  Take daily care of your teeth and gums.  Stay active. Exercise for at least 30 minutes on 5 or more days each week.  Do not use any products that contain nicotine or tobacco, such as cigarettes, e-cigarettes, and chewing tobacco. If you need help quitting, ask your health care provider.  If you are sexually active, practice safe sex. Use a condom or other form of protection to prevent STIs (sexually transmitted infections).  Talk with your health care provider about taking a low-dose aspirin every day starting at age 53. What's next?  Go to your health care provider once a year for a well check visit.  Ask your health care provider how often you should have your eyes and teeth checked.  Stay up to date on all vaccines. This information is not intended to replace advice given to you by your health care provider. Make sure you discuss any questions you have with your health care provider. Document Revised: 03/01/2018 Document Reviewed: 03/01/2018 Elsevier Patient Education  2020 [employer].

## 2019-12-05 NOTE — Assessment & Plan Note (Signed)
ghm utd Check labs  See AVS  Pt cont' to work on diet and exercise

## 2019-12-05 NOTE — Progress Notes (Signed)
Patient ID: Jose [name], male    DOB: 06-Mar-1971  Age: 49 y.o. MRN: [id number]    Subjective:  Subjective    HPI Ren Aspinall presents for cpe   Pt has lost 30 lbs with keto diet    Review of Systems  Constitutional: Negative for appetite change, diaphoresis, fatigue and unexpected weight change.  Eyes: Negative for pain, redness and visual disturbance.  Respiratory: Negative for cough, chest tightness, shortness of breath and wheezing.   Cardiovascular: Negative for chest pain, palpitations and leg swelling.  Endocrine: Negative for cold intolerance, heat intolerance, polydipsia, polyphagia and polyuria.  Genitourinary: Negative for difficulty urinating, dysuria and frequency.  Neurological: Negative for dizziness, light-headedness, numbness and headaches.    History Past Medical History:  Diagnosis Date  . Herpes   . Insomnia     He has no past surgical history on file.   His family history includes Cancer - Other in his paternal uncle; Dementia in his father; Diabetes in his father; Diabetes Mellitus II in his mother; Heart attack in his paternal uncle; Hyperlipidemia in his father; Hypertension in his father; Skin cancer in his paternal grandmother; Stomach cancer in his paternal grandmother.He reports that he has never smoked. He has never used smokeless tobacco. He reports current alcohol use. He reports that he does not use drugs.  Current Outpatient Medications on File Prior to Visit  Medication Sig Dispense Refill  . zolpidem (AMBIEN) 5 MG tablet Take 1 tablet (5 mg total) by mouth at bedtime as needed for sleep. (Patient not taking: Reported on 09/24/2019) 15 tablet 1   No current facility-administered medications on file prior to visit.     Objective:  Objective  Physical Exam Vitals and nursing note reviewed.  Constitutional:      General: He is sleeping.     Appearance: He is well-developed.  HENT:     Head: Normocephalic and atraumatic.  Eyes:     Pupils:  Pupils are equal, round, and reactive to light.  Neck:     Thyroid: No thyromegaly.  Cardiovascular:     Rate and Rhythm: Normal rate and regular rhythm.     Heart sounds: No murmur heard.   Pulmonary:     Effort: Pulmonary effort is normal. No respiratory distress.     Breath sounds: Normal breath sounds. No wheezing or rales.  Chest:     Chest wall: No tenderness.  Musculoskeletal:        General: No tenderness.     Cervical back: Normal range of motion and neck supple.  Skin:    General: Skin is warm and dry.  Neurological:     Mental Status: He is oriented to person, place, and time.  Psychiatric:        Behavior: Behavior normal.        Thought Content: Thought content normal.        Judgment: Judgment normal.    BP 100/70 (BP Location: Right Arm, Patient Position: Sitting, Cuff Size: Normal)   Pulse 66   Temp 97.7 F (36.5 C) (Oral)   Resp 18   Ht 5\' 9"  (1.753 m)   Wt 182 lb 12.8 oz (82.9 kg)   SpO2 97%   BMI 26.99 kg/m  Wt Readings from Last 3 Encounters:  12/05/19 182 lb 12.8 oz (82.9 kg)  09/24/19 181 lb 8 oz (82.3 kg)  11/29/18 206 lb (93.4 kg)     Lab Results  Component Value Date   WBC 6.6 12/05/2019  HGB 16.2 12/05/2019   HCT 46.7 12/05/2019   PLT 280 12/05/2019   GLUCOSE 180 (H) 11/29/2018   CHOL 216 (H) 11/29/2018   TRIG 198.0 (H) 11/29/2018   HDL 38.10 (L) 11/29/2018   LDLCALC 139 (H) 11/29/2018   ALT 61 (H) 11/29/2018   AST 34 11/29/2018   NA 136 11/29/2018   K 4.2 11/29/2018   CL 102 11/29/2018   CREATININE 1.05 11/29/2018   BUN 16 11/29/2018   CO2 28 11/29/2018   TSH 2.34 11/29/2018   PSA 0.87 11/29/2018   HGBA1C 5.2 08/04/2011    DG Chest 2 View  Result Date: 08/05/2011 *RADIOLOGY REPORT* Clinical Data: Leukocytosis and fatigue. CHEST - 2 VIEW Comparison: None. Findings: Heart and mediastinal contours are within normal limits. The lung fields appear clear with no signs of focal infiltrate or congestive failure.  No pleural fluid  or significant peribronchial cuffing is noted. Bony structures appear intact. IMPRESSION: No acute or worrisome focal cardiopulmonary abnormality noted Original Report Authenticated By: Ander Gaster, M.D.  Health Maintenance  Topic Date Due  . Hepatitis C Screening  Never done  . HIV Screening  Never done  . COVID-19 Vaccine (2 - Moderna 2-dose series) 06/27/2019  . TETANUS/TDAP  11/28/2028  . INFLUENZA VACCINE  Completed     Assessment & Plan:  Plan  I am having Jaryn Rosko maintain his zolpidem and valACYclovir.  Meds ordered this encounter  Medications  . valACYclovir (VALTREX) 1000 MG tablet    Sig: TAKE 1 TABLET(1000 MG) BY MOUTH DAILY    Dispense:  90 tablet    Refill:  1    Problem List Items Addressed This Visit      Unprioritized   Dyslipidemia    Encouraged heart healthy diet, increase exercise, avoid trans fats, consider a krill oil cap daily      Relevant Orders   Lipoprotein A (LPA)   Preventative health care - Primary    ghm utd Check labs  See AVS  Pt cont' to work on diet and exercise       Relevant Orders   Lipid panel   CBC with Differential/Platelet (Completed)   TSH   Comprehensive metabolic panel   PSA    Other Visit Diagnoses    Need for influenza vaccination       Relevant Orders   Flu Vaccine QUAD 36+ mos IM (Completed)   Herpes simplex infection of penis       Relevant Medications   valACYclovir (VALTREX) 1000 MG tablet      Follow-up: Return in about 1 year (around 12/04/2020), or if symptoms worsen or fail to improve, for annual exam, fasting.  [name], DO

## 2019-12-06 ENCOUNTER — Encounter: Payer: Self-pay | Admitting: Family Medicine

## 2019-12-07 ENCOUNTER — Other Ambulatory Visit: Payer: Self-pay | Admitting: Family Medicine

## 2019-12-07 DIAGNOSIS — R739 Hyperglycemia, unspecified: Secondary | ICD-10-CM

## 2019-12-10 ENCOUNTER — Encounter: Payer: Self-pay | Admitting: Family Medicine

## 2019-12-10 DIAGNOSIS — E785 Hyperlipidemia, unspecified: Secondary | ICD-10-CM

## 2019-12-10 LAB — LIPID PANEL
Cholesterol: 247 mg/dL — ABNORMAL HIGH (ref <200)
HDL: 49 mg/dL (ref >=40)
LDL Cholesterol (Calc): 177 mg/dL (calc) — ABNORMAL HIGH
Non-HDL Cholesterol (Calc): 198 mg/dL (calc) — ABNORMAL HIGH (ref <130)
Total CHOL/HDL Ratio: 5 (calc) — ABNORMAL HIGH (ref <5.0)
Triglycerides: 96 mg/dL (ref <150)

## 2019-12-10 LAB — TEST AUTHORIZATION

## 2019-12-10 LAB — COMPREHENSIVE METABOLIC PANEL
AG Ratio: 2 (calc) (ref 1.0–2.5)
ALT: 24 U/L (ref 9–46)
AST: 20 U/L (ref 10–40)
Albumin: 4.7 g/dL (ref 3.6–5.1)
Alkaline phosphatase (APISO): 60 U/L (ref 36–130)
BUN: 16 mg/dL (ref 7–25)
CO2: 26 mmol/L (ref 20–32)
Calcium: 9.5 mg/dL (ref 8.6–10.3)
Chloride: 103 mmol/L (ref 98–110)
Creat: 1.07 mg/dL (ref 0.60–1.35)
Globulin: 2.3 g/dL (calc) (ref 1.9–3.7)
Glucose, Bld: 141 mg/dL — ABNORMAL HIGH (ref 65–99)
Potassium: 4.8 mmol/L (ref 3.5–5.3)
Sodium: 139 mmol/L (ref 135–146)
Total Bilirubin: 0.7 mg/dL (ref 0.2–1.2)
Total Protein: 7 g/dL (ref 6.1–8.1)

## 2019-12-10 LAB — CBC WITH DIFFERENTIAL/PLATELET
Absolute Monocytes: 548 cells/uL (ref 200–950)
Basophils Absolute: 40 cells/uL (ref 0–200)
Basophils Relative: 0.6 %
Eosinophils Absolute: 356 cells/uL (ref 15–500)
Eosinophils Relative: 5.4 %
HCT: 46.7 % (ref 38.5–50.0)
Hemoglobin: 16.2 g/dL (ref 13.2–17.1)
Lymphs Abs: 1894 cells/uL (ref 850–3900)
MCH: 32.4 pg (ref 27.0–33.0)
MCHC: 34.7 g/dL (ref 32.0–36.0)
MCV: 93.4 fL (ref 80.0–100.0)
MPV: 9.4 fL (ref 7.5–12.5)
Monocytes Relative: 8.3 %
Neutro Abs: 3762 cells/uL (ref 1500–7800)
Neutrophils Relative %: 57 %
Platelets: 280 10*3/uL (ref 140–400)
RBC: 5 10*6/uL (ref 4.20–5.80)
RDW: 12.2 % (ref 11.0–15.0)
Total Lymphocyte: 28.7 %
WBC: 6.6 10*3/uL (ref 3.8–10.8)

## 2019-12-10 LAB — LIPOPROTEIN A (LPA): Lipoprotein (a): 144 nmol/L — ABNORMAL HIGH (ref <75)

## 2019-12-10 LAB — HEMOGLOBIN A1C W/OUT EAG: Hgb A1c MFr Bld: 5 % of total Hgb (ref <5.7)

## 2019-12-10 LAB — PSA: PSA: 0.71 ng/mL (ref <=4.0)

## 2019-12-10 LAB — TSH: TSH: 2.45 mIU/L (ref 0.40–4.50)

## 2019-12-11 ENCOUNTER — Other Ambulatory Visit: Payer: Self-pay

## 2019-12-11 MED ORDER — ROSUVASTATIN CALCIUM 20 MG PO TABS: 20.0000 mg | ORAL_TABLET | Freq: Every day | ORAL | 2 refills | Status: DC

## 2019-12-11 NOTE — Telephone Encounter (Signed)
Please call him We discussed this during his visit ----- Lpa is part of nmr  Many ins companies stopped paying for the NMR which is why I did not order it If he would like it to be done I can order it but can not guarantee his ins will pay for it

## 2019-12-18 ENCOUNTER — Other Ambulatory Visit (INDEPENDENT_AMBULATORY_CARE_PROVIDER_SITE_OTHER): Payer: BC Managed Care – PPO

## 2019-12-18 ENCOUNTER — Other Ambulatory Visit: Payer: Self-pay

## 2019-12-18 DIAGNOSIS — E785 Hyperlipidemia, unspecified: Secondary | ICD-10-CM

## 2019-12-20 LAB — NMR, LIPOPROFILE
Cholesterol, Total: 249 mg/dL — ABNORMAL HIGH (ref 100–199)
HDL Particle Number: 24.2 umol/L — ABNORMAL LOW (ref >=30.5)
HDL-C: 45 mg/dL (ref >39)
LDL Particle Number: 2289 nmol/L — ABNORMAL HIGH (ref <1000)
LDL Size: 21 nm (ref >20.5)
LDL-C (NIH Calc): 183 mg/dL — ABNORMAL HIGH (ref 0–99)
LP-IR Score: 60 — ABNORMAL HIGH (ref <=45)
Small LDL Particle Number: 674 nmol/L — ABNORMAL HIGH (ref <=527)
Triglycerides: 119 mg/dL (ref 0–149)

## 2019-12-25 DIAGNOSIS — Z20822 Contact with and (suspected) exposure to covid-19: Secondary | ICD-10-CM | POA: Diagnosis not present

## 2019-12-26 DIAGNOSIS — Z20822 Contact with and (suspected) exposure to covid-19: Secondary | ICD-10-CM | POA: Diagnosis not present

## 2019-12-26 DIAGNOSIS — Z03818 Encounter for observation for suspected exposure to other biological agents ruled out: Secondary | ICD-10-CM | POA: Diagnosis not present

## 2020-01-08 DIAGNOSIS — M9905 Segmental and somatic dysfunction of pelvic region: Secondary | ICD-10-CM | POA: Diagnosis not present

## 2020-01-08 DIAGNOSIS — M9903 Segmental and somatic dysfunction of lumbar region: Secondary | ICD-10-CM | POA: Diagnosis not present

## 2020-01-08 DIAGNOSIS — M5117 Intervertebral disc disorders with radiculopathy, lumbosacral region: Secondary | ICD-10-CM | POA: Diagnosis not present

## 2020-01-08 DIAGNOSIS — Q6589 Other specified congenital deformities of hip: Secondary | ICD-10-CM | POA: Diagnosis not present

## 2020-01-10 ENCOUNTER — Other Ambulatory Visit: Payer: Self-pay | Admitting: Gastroenterology

## 2020-01-10 ENCOUNTER — Telehealth: Payer: Self-pay | Admitting: Gastroenterology

## 2020-01-10 DIAGNOSIS — Z1211 Encounter for screening for malignant neoplasm of colon: Secondary | ICD-10-CM

## 2020-01-10 MED ORDER — CLENPIQ 10-3.5-12 MG-GM -GM/160ML PO SOLN: 1.0000 | Freq: Once | ORAL | 0 refills | Status: AC

## 2020-01-10 NOTE — Telephone Encounter (Signed)
Spoke to patient. It was recommended that patient has a screening colonoscopy first then proceed with the hemorrhoid banding. Colonoscopy scheduled. Instructions verbalized over the phone . All questions answered. Patient voiced understanding.

## 2020-01-10 NOTE — Telephone Encounter (Signed)
Pls call pt, he is interested in scheduling banding that Dr. Loletha Grayer had recommended.

## 2020-01-13 ENCOUNTER — Telehealth: Payer: Self-pay | Admitting: General Surgery

## 2020-01-13 NOTE — Telephone Encounter (Signed)
Clenpiq not covered by insurance called [facility] and spoke with Ebony Hail, she ran 2 clenpiq codes one brings it to 65 off at 128.00 and 2nd code brought the Clenpiq to 49.99. Asked for them to fill the prescription with discount code.

## 2020-01-14 DIAGNOSIS — M9901 Segmental and somatic dysfunction of cervical region: Secondary | ICD-10-CM | POA: Diagnosis not present

## 2020-01-14 DIAGNOSIS — M9903 Segmental and somatic dysfunction of lumbar region: Secondary | ICD-10-CM | POA: Diagnosis not present

## 2020-01-14 DIAGNOSIS — M25551 Pain in right hip: Secondary | ICD-10-CM | POA: Diagnosis not present

## 2020-01-14 DIAGNOSIS — M50822 Other cervical disc disorders at C5-C6 level: Secondary | ICD-10-CM | POA: Diagnosis not present

## 2020-01-14 DIAGNOSIS — M9905 Segmental and somatic dysfunction of pelvic region: Secondary | ICD-10-CM | POA: Diagnosis not present

## 2020-01-14 DIAGNOSIS — M5416 Radiculopathy, lumbar region: Secondary | ICD-10-CM | POA: Diagnosis not present

## 2020-01-17 DIAGNOSIS — M9903 Segmental and somatic dysfunction of lumbar region: Secondary | ICD-10-CM | POA: Diagnosis not present

## 2020-01-17 DIAGNOSIS — M25551 Pain in right hip: Secondary | ICD-10-CM | POA: Diagnosis not present

## 2020-01-17 DIAGNOSIS — M5416 Radiculopathy, lumbar region: Secondary | ICD-10-CM | POA: Diagnosis not present

## 2020-01-17 DIAGNOSIS — M9905 Segmental and somatic dysfunction of pelvic region: Secondary | ICD-10-CM | POA: Diagnosis not present

## 2020-01-17 DIAGNOSIS — M9901 Segmental and somatic dysfunction of cervical region: Secondary | ICD-10-CM | POA: Diagnosis not present

## 2020-01-17 DIAGNOSIS — M50822 Other cervical disc disorders at C5-C6 level: Secondary | ICD-10-CM | POA: Diagnosis not present

## 2020-01-21 ENCOUNTER — Encounter: Payer: Self-pay | Admitting: Gastroenterology

## 2020-01-21 DIAGNOSIS — M9901 Segmental and somatic dysfunction of cervical region: Secondary | ICD-10-CM | POA: Diagnosis not present

## 2020-01-21 DIAGNOSIS — M5416 Radiculopathy, lumbar region: Secondary | ICD-10-CM | POA: Diagnosis not present

## 2020-01-21 DIAGNOSIS — M50822 Other cervical disc disorders at C5-C6 level: Secondary | ICD-10-CM | POA: Diagnosis not present

## 2020-01-21 DIAGNOSIS — M9903 Segmental and somatic dysfunction of lumbar region: Secondary | ICD-10-CM | POA: Diagnosis not present

## 2020-01-23 DIAGNOSIS — F411 Generalized anxiety disorder: Secondary | ICD-10-CM | POA: Diagnosis not present

## 2020-01-24 DIAGNOSIS — M9901 Segmental and somatic dysfunction of cervical region: Secondary | ICD-10-CM | POA: Diagnosis not present

## 2020-01-24 DIAGNOSIS — M5416 Radiculopathy, lumbar region: Secondary | ICD-10-CM | POA: Diagnosis not present

## 2020-01-24 DIAGNOSIS — M50822 Other cervical disc disorders at C5-C6 level: Secondary | ICD-10-CM | POA: Diagnosis not present

## 2020-01-24 DIAGNOSIS — M9903 Segmental and somatic dysfunction of lumbar region: Secondary | ICD-10-CM | POA: Diagnosis not present

## 2020-01-27 ENCOUNTER — Ambulatory Visit: Payer: BC Managed Care – PPO | Admitting: Gastroenterology

## 2020-01-27 DIAGNOSIS — M5416 Radiculopathy, lumbar region: Secondary | ICD-10-CM | POA: Diagnosis not present

## 2020-01-27 DIAGNOSIS — M47817 Spondylosis without myelopathy or radiculopathy, lumbosacral region: Secondary | ICD-10-CM | POA: Diagnosis not present

## 2020-01-27 DIAGNOSIS — M5137 Other intervertebral disc degeneration, lumbosacral region: Secondary | ICD-10-CM | POA: Diagnosis not present

## 2020-01-27 DIAGNOSIS — M5186 Other intervertebral disc disorders, lumbar region: Secondary | ICD-10-CM | POA: Diagnosis not present

## 2020-01-28 DIAGNOSIS — M50822 Other cervical disc disorders at C5-C6 level: Secondary | ICD-10-CM | POA: Diagnosis not present

## 2020-01-28 DIAGNOSIS — M9903 Segmental and somatic dysfunction of lumbar region: Secondary | ICD-10-CM | POA: Diagnosis not present

## 2020-01-28 DIAGNOSIS — M5416 Radiculopathy, lumbar region: Secondary | ICD-10-CM | POA: Diagnosis not present

## 2020-01-28 DIAGNOSIS — M9901 Segmental and somatic dysfunction of cervical region: Secondary | ICD-10-CM | POA: Diagnosis not present

## 2020-02-03 ENCOUNTER — Telehealth: Payer: Self-pay

## 2020-02-03 DIAGNOSIS — M9905 Segmental and somatic dysfunction of pelvic region: Secondary | ICD-10-CM | POA: Diagnosis not present

## 2020-02-03 DIAGNOSIS — M9903 Segmental and somatic dysfunction of lumbar region: Secondary | ICD-10-CM | POA: Diagnosis not present

## 2020-02-03 DIAGNOSIS — M5117 Intervertebral disc disorders with radiculopathy, lumbosacral region: Secondary | ICD-10-CM | POA: Diagnosis not present

## 2020-02-03 DIAGNOSIS — Q6589 Other specified congenital deformities of hip: Secondary | ICD-10-CM | POA: Diagnosis not present

## 2020-02-03 NOTE — Telephone Encounter (Signed)
I called Mr. Parchment to verify his COVID-19 vaccine. It is in his chart that he has only had the first dose of the Moderna vaccine in March of 2021. He did not answer. I left him a voicemail to call the office back as soon as he could.

## 2020-02-04 ENCOUNTER — Telehealth: Payer: Self-pay | Admitting: Gastroenterology

## 2020-02-04 NOTE — Telephone Encounter (Signed)
Pt is wanting to discuss with a nurse that he has ate seeds last two days, pt would like to ensure that he can still keep his colonoscopy scheduled for tomorrow 11/17.

## 2020-02-04 NOTE — Telephone Encounter (Signed)
Have we been able to get in touch with this patient? If he has not had the 2nd vaccine, would likely need to send for rapid Covid testing today prior to scheduled procedure tomorrow.

## 2020-02-04 NOTE — Telephone Encounter (Signed)
Pt calls back, let him know it is ok he had seeds, try to drink extra liquids today, pt verb understanding.

## 2020-02-04 NOTE — Telephone Encounter (Signed)
Spoke with Jose [name], patient has had his last covid vaccination on 4/18/2021[name] [phone]

## 2020-02-04 NOTE — Telephone Encounter (Signed)
Pt is scheduled to see Dr. Bryan Lemma on 02/05/20 at 1100. Returned patient call about concerns for procedure because he ate seeds. Left VM to have pt call [facility].

## 2020-02-05 ENCOUNTER — Telehealth: Payer: Self-pay | Admitting: General Surgery

## 2020-02-05 ENCOUNTER — Ambulatory Visit (AMBULATORY_SURGERY_CENTER): Payer: BC Managed Care – PPO | Admitting: Gastroenterology

## 2020-02-05 ENCOUNTER — Encounter: Payer: Self-pay | Admitting: Gastroenterology

## 2020-02-05 ENCOUNTER — Other Ambulatory Visit: Payer: Self-pay

## 2020-02-05 VITALS — BP 109/61 | HR 59 | Temp 98.0°F | Resp 17 | Ht 69.0 in | Wt 182.0 lb

## 2020-02-05 DIAGNOSIS — K602 Anal fissure, unspecified: Secondary | ICD-10-CM

## 2020-02-05 DIAGNOSIS — K641 Second degree hemorrhoids: Secondary | ICD-10-CM

## 2020-02-05 DIAGNOSIS — D128 Benign neoplasm of rectum: Secondary | ICD-10-CM

## 2020-02-05 DIAGNOSIS — Z1211 Encounter for screening for malignant neoplasm of colon: Secondary | ICD-10-CM | POA: Diagnosis not present

## 2020-02-05 MED ORDER — AMBULATORY NON FORMULARY MEDICATION: 1.0000 "application " | Freq: Two times a day (BID) | 3 refills | Status: DC

## 2020-02-05 MED ORDER — SODIUM CHLORIDE 0.9 % IV SOLN: 500.0000 mL | Freq: Once | INTRAVENOUS | Status: DC

## 2020-02-05 NOTE — Patient Instructions (Addendum)
YOU HAD AN ENDOSCOPIC PROCEDURE TODAY AT [facility]:   Refer to the procedure report that was given to you for any specific questions about what was found during the examination.  If the procedure report does not answer your questions, please call your gastroenterologist to clarify.  If you requested that your care partner not be given the details of your procedure findings, then the procedure report has been included in a sealed envelope for you to review at your convenience later.  YOU SHOULD EXPECT: Some feelings of bloating in the abdomen. Passage of more gas than usual.  Walking can help get rid of the air that was put into your GI tract during the procedure and reduce the bloating. If you had a lower endoscopy (such as a colonoscopy or flexible sigmoidoscopy) you may notice spotting of blood in your stool or on the toilet paper. If you underwent a bowel prep for your procedure, you may not have a normal bowel movement for a few days.  Please Note:  You might notice some irritation and congestion in your nose or some drainage.  This is from the oxygen used during your procedure.  There is no need for concern and it should clear up in a day or so.  SYMPTOMS TO REPORT IMMEDIATELY:   Following lower endoscopy (colonoscopy or flexible sigmoidoscopy):  Excessive amounts of blood in the stool  Significant tenderness or worsening of abdominal pains  Swelling of the abdomen that is new, acute  Fever of 100F or higher   For urgent or emergent issues, a gastroenterologist can be reached at any hour by calling [phone]. Do not use MyChart messaging for urgent concerns.    DIET:  We do recommend a small meal at first, but then you may proceed to your regular diet.  Drink plenty of fluids but you should avoid alcoholic beverages for 24 hours.  MEDICATIONS: Continue present medications. Start Nitroglycerin 0.125% ointment. Apply a pea-sized amount to the affected area twice  daily for 6-8 weeks. Avoid strenuous activity/exercise within 30 minutes of application. This medication is compounded at and must be picked up at [address], [place] [id number].   Please see handouts given to you by your recovery nurse.  ACTIVITY:  You should plan to take it easy for the rest of today and you should NOT DRIVE or use heavy machinery until tomorrow (because of the sedation medicines used during the test).    FOLLOW UP: Our staff will call the number listed on your records 48-72 hours following your procedure to check on you and address any questions or concerns that you may have regarding the information given to you following your procedure. If we do not reach you, we will leave a message.  We will attempt to reach you two times.  During this call, we will ask if you have developed any symptoms of COVID 19. If you develop any symptoms (ie: fever, flu-like symptoms, shortness of breath, cough etc.) before then, please call [phone].  If you test positive for Covid 19 in the 2 weeks post procedure, please call and report this information to [place].    If any biopsies were taken you will be contacted by phone or by letter within the next 1-3 weeks.  Please call us at [phone] if you have not heard about the biopsies in 3 weeks.   Thank you for allowing [place] to provide for your healthcare needs today.  SIGNATURES/CONFIDENTIALITY: You and/or your care partner have signed paperwork which will be entered into your electronic medical record.  These signatures attest to the fact that that the information above on your After Visit Summary has been reviewed and is understood.  Full responsibility of the confidentiality of this discharge information lies with you and/or your care-partner.

## 2020-02-05 NOTE — Progress Notes (Signed)
History reviewed today  VS CW  

## 2020-02-05 NOTE — Progress Notes (Signed)
Called to room to assist during endoscopic procedure.  Patient ID and intended procedure confirmed with present staff. Received instructions for my participation in the procedure from the performing physician.  

## 2020-02-05 NOTE — Op Note (Signed)
[facility] Patient Name: Jose [name] Procedure Date: 02/05/2020 11:33 AM MRN: [id number] Endoscopist: Gerrit Heck , MD Age: 49 Referring MD:  Date of Birth: 03/16/1971 Gender: Male Account #: [id number] Procedure:                Colonoscopy Indications:              Screening for colorectal malignant neoplasm, This                            is the patient's first colonoscopy Medicines:                Monitored Anesthesia Care Procedure:                Pre-Anesthesia Assessment:                           - Prior to the procedure, a History and Physical                            was performed, and patient medications and                            allergies were reviewed. The patient's tolerance of                            previous anesthesia was also reviewed. The risks                            and benefits of the procedure and the sedation                            options and risks were discussed with the patient.                            All questions were answered, and informed consent                            was obtained. Prior Anticoagulants: The patient has                            taken no previous anticoagulant or antiplatelet                            agents. ASA Grade Assessment: I - A normal, healthy                            patient. After reviewing the risks and benefits,                            the patient was deemed in satisfactory condition to                            undergo the procedure.  After obtaining informed consent, the colonoscope                            was passed under direct vision. Throughout the                            procedure, the patient's blood pressure, pulse, and                            oxygen saturations were monitored continuously. The                            Colonoscope was introduced through the anus and                            advanced to the the terminal ileum.  The colonoscopy                            was performed without difficulty. The patient                            tolerated the procedure well. The quality of the                            bowel preparation was good. The terminal ileum,                            ileocecal valve, appendiceal orifice, and rectum                            were photographed. Scope In: 11:49:34 AM Scope Out: 12:07:08 PM Scope Withdrawal Time: 0 hours 15 minutes 18 seconds  Total Procedure Duration: 0 hours 17 minutes 34 seconds  Findings:                 A small, internal anal fissure and Grade 2 internal                            hemorrhoids were found on perianal exam.                           Two sessile polyps were found in the rectum. The                            polyps were 2 to 5 mm in size. These polyps were                            removed with a cold snare. Resection and retrieval                            were complete. Estimated blood loss was minimal.                           Non-bleeding internal hemorrhoids were  found during                            retroflexion. The hemorrhoids were small and Grade                            II (internal hemorrhoids that prolapse but reduce                            spontaneously).                           The exam was otherwise normal throughout the                            remainder of the colon.                           Retroflexion in the right colon was performed.                           The terminal ileum appeared normal. Complications:            No immediate complications. Estimated Blood Loss:     Estimated blood loss was minimal. Impression:               - Anal fissure found on perianal exam.                           - Two 2 to 5 mm polyps in the rectum, removed with                            a cold snare. Resected and retrieved.                           - Non-bleeding internal hemorrhoids.                           - The  examined portion of the ileum was normal. Recommendation:           - Patient has a contact number available for                            emergencies. The signs and symptoms of potential                            delayed complications were discussed with the                            patient. Return to normal activities tomorrow.                            Written discharge instructions were provided to the                            patient.                           -  Resume previous diet.                           - Continue present medications.                           - Await pathology results.                           - Repeat colonoscopy in 5-10 years for surveillance                            based on pathology results.                           - Return to GI office PRN.                           - Start topical Nitroglycerine 0.125%. Apply a                            pea-sized amount to the affected area twice daily                            for 6-8 weeks. Avoid strenuous activity/exercise                            within 30 minutes of application.                           - Internal hemorrhoids were noted on this study and                            may be amenable to hemorrhoid band ligation. If you                            are interested in further treatment of these                            hemorrhoids with band ligation, please contact my                            clinic to set up an appointment for evaluation and                            treatment. Would plan for hemorrhoid banding after                            treatment of the internal anal fissure. Gerrit Heck, MD 02/05/2020 12:18:20 PM

## 2020-02-05 NOTE — Telephone Encounter (Signed)
-----   Message from [place], DO sent at 02/05/2020  1:30 PM EST ----- Please set up for hemorrhoid banding. He is hoping to get 2 bandings done prior to year-end. Ok with me to overbook to make that happen for him. Can split appts by 3 weeks if needed. Thanks.

## 2020-02-05 NOTE — Progress Notes (Signed)
pt tolerated well. VSS. awake and to recovery. Report given to RN.  

## 2020-02-05 NOTE — Telephone Encounter (Signed)
Left a detailed voicemail for the patient to advise him of the banding appointments that were scheduled. Patient advised to call if they have any questions.

## 2020-02-06 NOTE — Progress Notes (Signed)
The 14:06 ETCO2 value was entered in error.  [facility] [employer] and the [employer]  were onsite testing the CO2 function at that time. This reading was not obtained from the patient Jose [name].

## 2020-02-07 ENCOUNTER — Telehealth: Payer: Self-pay

## 2020-02-07 DIAGNOSIS — M9903 Segmental and somatic dysfunction of lumbar region: Secondary | ICD-10-CM | POA: Diagnosis not present

## 2020-02-07 DIAGNOSIS — M9905 Segmental and somatic dysfunction of pelvic region: Secondary | ICD-10-CM | POA: Diagnosis not present

## 2020-02-07 DIAGNOSIS — M5117 Intervertebral disc disorders with radiculopathy, lumbosacral region: Secondary | ICD-10-CM | POA: Diagnosis not present

## 2020-02-07 DIAGNOSIS — Q6589 Other specified congenital deformities of hip: Secondary | ICD-10-CM | POA: Diagnosis not present

## 2020-02-07 NOTE — Telephone Encounter (Signed)
°  Follow up Call-  Call back number 02/05/2020  Post procedure Call Back phone  # [phone]  Permission to leave phone message Yes  Some recent data might be hidden     Patient questions:  Do you have a fever, pain , or abdominal swelling? No. Pain Score  0 *  Have you tolerated food without any problems? Yes.    Have you been able to return to your normal activities? Yes.    Do you have any questions about your discharge instructions: Diet   No. Medications  No. Follow up visit  No.  Do you have questions or concerns about your Care? No.  Actions: * If pain score is 4 or above: No action needed, pain <4. 1. Have you developed a fever since your procedure? no  2.   Have you had an respiratory symptoms (SOB or cough) since your procedure? no  3.   Have you tested positive for COVID 19 since your procedure no  4.   Have you had any family members/close contacts diagnosed with the COVID 19 since your procedure?  no   If yes to any of these questions please route to Joylene John, RN and Joella Prince, RN

## 2020-02-11 DIAGNOSIS — M5117 Intervertebral disc disorders with radiculopathy, lumbosacral region: Secondary | ICD-10-CM | POA: Diagnosis not present

## 2020-02-11 DIAGNOSIS — M9905 Segmental and somatic dysfunction of pelvic region: Secondary | ICD-10-CM | POA: Diagnosis not present

## 2020-02-11 DIAGNOSIS — Q6589 Other specified congenital deformities of hip: Secondary | ICD-10-CM | POA: Diagnosis not present

## 2020-02-11 DIAGNOSIS — M9903 Segmental and somatic dysfunction of lumbar region: Secondary | ICD-10-CM | POA: Diagnosis not present

## 2020-02-12 DIAGNOSIS — Q6589 Other specified congenital deformities of hip: Secondary | ICD-10-CM | POA: Diagnosis not present

## 2020-02-12 DIAGNOSIS — M9905 Segmental and somatic dysfunction of pelvic region: Secondary | ICD-10-CM | POA: Diagnosis not present

## 2020-02-12 DIAGNOSIS — M5117 Intervertebral disc disorders with radiculopathy, lumbosacral region: Secondary | ICD-10-CM | POA: Diagnosis not present

## 2020-02-12 DIAGNOSIS — M9903 Segmental and somatic dysfunction of lumbar region: Secondary | ICD-10-CM | POA: Diagnosis not present

## 2020-02-18 ENCOUNTER — Encounter: Payer: Self-pay | Admitting: Gastroenterology

## 2020-02-18 ENCOUNTER — Ambulatory Visit (INDEPENDENT_AMBULATORY_CARE_PROVIDER_SITE_OTHER): Payer: BC Managed Care – PPO | Admitting: Gastroenterology

## 2020-02-18 VITALS — BP 138/82 | HR 61 | Ht 69.0 in | Wt 179.5 lb

## 2020-02-18 DIAGNOSIS — K641 Second degree hemorrhoids: Secondary | ICD-10-CM | POA: Diagnosis not present

## 2020-02-18 DIAGNOSIS — K602 Anal fissure, unspecified: Secondary | ICD-10-CM

## 2020-02-18 DIAGNOSIS — Z860100 Personal history of colon polyps, unspecified: Secondary | ICD-10-CM

## 2020-02-18 NOTE — Patient Instructions (Signed)

## 2020-02-18 NOTE — Progress Notes (Signed)
P  Chief Complaint:    Symptomatic Internal Hemorrhoids; Hemorrhoid Band Ligation  GI History: 49 year old [name] initially seen in the GI clinic in 03/2018 for symptomatic hemorrhoids (endorses rectal itching, leakage, incomplete cleaning after BM, and intermittent BRBPR.  Rare dyschezia.  Suboptimal response to appropriate trials of Proctofoam HC, Preparation H, and other topical medications.  Family history notable for sister with Ulcerative Colitis.   Endoscopic history: -Colonoscopy (01/2020, Dr. Bryan Lemma): Small internal anal fissure, grade 2 hemorrhoids, 2 rectal polyps (path: Tubular adenoma).  Repeat in 7 years  HPI:     Patient is a Jose y.o. malewith a history of symptomatic internal hemorrhoids presenting to the Gastroenterology Clinic for follow-up and ongoing treatment. The patient presents with symptomatic grade 2 hemorrhoids, unresponsive to maximal medical therapy, requesting rubber band ligation of symptomatic hemorrhoidal disease.  Colonoscopy earlier this month was also notable for small internal anal fissure.  Was treated with topical NTG 0.125%, which she took for a few days, then stopped, and has started using again over the last couple of days.    No change in medical or surgical history, medications, allergies, social history since last appointment with me.   Review of systems:     No chest pain, no SOB, no fevers, no urinary sx   Past Medical History:  Diagnosis Date  . Arthritis    R hip  . Herpes   . Insomnia     Patient's surgical history, family medical history, social history, medications and allergies were all reviewed in Epic    Current Outpatient Medications  Medication Sig Dispense Refill  . AMBULATORY NON FORMULARY MEDICATION Place 1 application rectally 2 (two) times daily. Medication Name: Nitroglycerin 0.125% gel. Apply pea size amount to the rectum three times a day as needed 30 g 3  . traZODone (DESYREL) 100 MG tablet     .  valACYclovir (VALTREX) 1000 MG tablet TAKE 1 TABLET(1000 MG) BY MOUTH DAILY 90 tablet 1  . zolpidem (AMBIEN) 5 MG tablet Take 1 tablet (5 mg total) by mouth at bedtime as needed for sleep. (Patient not taking: Reported on 09/24/2019) 15 tablet 1   No current facility-administered medications for this visit.    Physical Exam:     BP 138/Jose (BP Location: Left Arm, Patient Position: Sitting, Cuff Size: Normal)   Pulse 61   Ht 5\' 9"  (1.753 m)   Wt 179 lb 8 oz (81.4 kg)   BMI 26.51 kg/m   GENERAL:  Pleasant [name] in NAD PSYCH: : Cooperative, normal affect Rectal exam: Sensation intact and preserved anal wink.   Small internal anal fissure in 9 o'clock position.  Grade 2 hemorrhoids noted in all positions on anoscopy.  Normal sphincter tone. No palpable mass. No blood on the exam glove. (Chaperone: Lanny Hurst, CMA).   IMPRESSION and PLAN:    #1.  Symptomatic internal hemorrhoids: PROCEDURE NOTE: The patient presents with symptomatic grade 2 hemorrhoids, unresponsive to maximal medical therapy, requesting rubber band ligation of symptomatic hemorrhoidal disease.  All risks, benefits and alternative forms of therapy were described and informed consent was obtained.  In the Left Lateral Decubitus position, anoscopic examination revealed grade 2 hemorrhoids in the all position(s).  The anorectum was pre-medicated with RectiCare. The decision was made to band the RA internal hemorrhoid, and the [place] was used to perform band ligation without complication.  Digital anorectal examination was then performed to assure proper positioning of the band, and to adjust the banded  tissue as required.  The patient was discharged home without pain or other issues.  Dietary and behavioral recommendations were given and along with follow-up instructions.     The following adjunctive treatments were recommended:  -Resume high-fiber diet with fiber supplement (i.e. Citrucel or Benefiber) with goal  for soft stools without straining to have a BM. -Resume adequate fluid intake.  The patient will return in 2-4 for  follow-up and possible additional banding as required. No complications were encountered and the patient tolerated the procedure well.      #2.  Anal fissure -Resume topical NTG -Observe for clinical improvement with serial hemorrhoid banding  #3.  Tubular adenoma -Repeat colonoscopy 2028 for ongoing polyp surveillance      Lavena Bullion ,DO, FACG 02/18/2020, 2:51 PM

## 2020-02-19 DIAGNOSIS — M9905 Segmental and somatic dysfunction of pelvic region: Secondary | ICD-10-CM | POA: Diagnosis not present

## 2020-02-19 DIAGNOSIS — M5117 Intervertebral disc disorders with radiculopathy, lumbosacral region: Secondary | ICD-10-CM | POA: Diagnosis not present

## 2020-02-19 DIAGNOSIS — Q6589 Other specified congenital deformities of hip: Secondary | ICD-10-CM | POA: Diagnosis not present

## 2020-02-19 DIAGNOSIS — M9903 Segmental and somatic dysfunction of lumbar region: Secondary | ICD-10-CM | POA: Diagnosis not present

## 2020-02-20 ENCOUNTER — Encounter: Payer: Self-pay | Admitting: Gastroenterology

## 2020-02-25 DIAGNOSIS — M9903 Segmental and somatic dysfunction of lumbar region: Secondary | ICD-10-CM | POA: Diagnosis not present

## 2020-02-25 DIAGNOSIS — M9905 Segmental and somatic dysfunction of pelvic region: Secondary | ICD-10-CM | POA: Diagnosis not present

## 2020-02-25 DIAGNOSIS — Q6589 Other specified congenital deformities of hip: Secondary | ICD-10-CM | POA: Diagnosis not present

## 2020-02-25 DIAGNOSIS — M5117 Intervertebral disc disorders with radiculopathy, lumbosacral region: Secondary | ICD-10-CM | POA: Diagnosis not present

## 2020-02-26 DIAGNOSIS — F411 Generalized anxiety disorder: Secondary | ICD-10-CM | POA: Diagnosis not present

## 2020-03-02 DIAGNOSIS — M5117 Intervertebral disc disorders with radiculopathy, lumbosacral region: Secondary | ICD-10-CM | POA: Diagnosis not present

## 2020-03-02 DIAGNOSIS — M9903 Segmental and somatic dysfunction of lumbar region: Secondary | ICD-10-CM | POA: Diagnosis not present

## 2020-03-02 DIAGNOSIS — Q6589 Other specified congenital deformities of hip: Secondary | ICD-10-CM | POA: Diagnosis not present

## 2020-03-02 DIAGNOSIS — M9905 Segmental and somatic dysfunction of pelvic region: Secondary | ICD-10-CM | POA: Diagnosis not present

## 2020-03-04 DIAGNOSIS — M9903 Segmental and somatic dysfunction of lumbar region: Secondary | ICD-10-CM | POA: Diagnosis not present

## 2020-03-04 DIAGNOSIS — M9905 Segmental and somatic dysfunction of pelvic region: Secondary | ICD-10-CM | POA: Diagnosis not present

## 2020-03-04 DIAGNOSIS — M5117 Intervertebral disc disorders with radiculopathy, lumbosacral region: Secondary | ICD-10-CM | POA: Diagnosis not present

## 2020-03-04 DIAGNOSIS — Q6589 Other specified congenital deformities of hip: Secondary | ICD-10-CM | POA: Diagnosis not present

## 2020-03-07 DIAGNOSIS — M5117 Intervertebral disc disorders with radiculopathy, lumbosacral region: Secondary | ICD-10-CM | POA: Diagnosis not present

## 2020-03-07 DIAGNOSIS — Q6589 Other specified congenital deformities of hip: Secondary | ICD-10-CM | POA: Diagnosis not present

## 2020-03-07 DIAGNOSIS — M9903 Segmental and somatic dysfunction of lumbar region: Secondary | ICD-10-CM | POA: Diagnosis not present

## 2020-03-07 DIAGNOSIS — M9905 Segmental and somatic dysfunction of pelvic region: Secondary | ICD-10-CM | POA: Diagnosis not present

## 2020-03-09 DIAGNOSIS — M9905 Segmental and somatic dysfunction of pelvic region: Secondary | ICD-10-CM | POA: Diagnosis not present

## 2020-03-09 DIAGNOSIS — M5117 Intervertebral disc disorders with radiculopathy, lumbosacral region: Secondary | ICD-10-CM | POA: Diagnosis not present

## 2020-03-09 DIAGNOSIS — M9903 Segmental and somatic dysfunction of lumbar region: Secondary | ICD-10-CM | POA: Diagnosis not present

## 2020-03-09 DIAGNOSIS — Q6589 Other specified congenital deformities of hip: Secondary | ICD-10-CM | POA: Diagnosis not present

## 2020-03-11 ENCOUNTER — Encounter: Payer: Self-pay | Admitting: Gastroenterology

## 2020-03-11 ENCOUNTER — Ambulatory Visit (INDEPENDENT_AMBULATORY_CARE_PROVIDER_SITE_OTHER): Payer: BC Managed Care – PPO | Admitting: Gastroenterology

## 2020-03-11 VITALS — BP 138/72 | HR 68 | Ht 69.0 in | Wt 175.0 lb

## 2020-03-11 DIAGNOSIS — M5117 Intervertebral disc disorders with radiculopathy, lumbosacral region: Secondary | ICD-10-CM | POA: Diagnosis not present

## 2020-03-11 DIAGNOSIS — M9905 Segmental and somatic dysfunction of pelvic region: Secondary | ICD-10-CM | POA: Diagnosis not present

## 2020-03-11 DIAGNOSIS — Q6589 Other specified congenital deformities of hip: Secondary | ICD-10-CM | POA: Diagnosis not present

## 2020-03-11 DIAGNOSIS — K641 Second degree hemorrhoids: Secondary | ICD-10-CM | POA: Diagnosis not present

## 2020-03-11 DIAGNOSIS — Z86010 Personal history of colonic polyps: Secondary | ICD-10-CM

## 2020-03-11 DIAGNOSIS — M9903 Segmental and somatic dysfunction of lumbar region: Secondary | ICD-10-CM | POA: Diagnosis not present

## 2020-03-11 NOTE — Patient Instructions (Signed)
If you are age 48 or older, your body mass index should be between 23-30. Your Body mass index is 25.84 kg/m. If this is out of the aforementioned range listed, please consider follow up with your Primary Care Provider.  If you are age 69 or younger, your body mass index should be between 19-25. Your Body mass index is 25.84 kg/m. If this is out of the aformentioned range listed, please consider follow up with your Primary Care Provider.   HEMORRHOID BANDING PROCEDURE    FOLLOW-UP CARE   1. The procedure you have had should have been relatively painless since the banding of the area involved does not have nerve endings and there is no pain sensation.  The rubber band cuts off the blood supply to the hemorrhoid and the band may fall off as soon as 48 hours after the banding (the band may occasionally be seen in the toilet bowl following a bowel movement). You may notice a temporary feeling of fullness in the rectum which should respond adequately to plain Tylenol or Motrin.  2. Following the banding, avoid strenuous exercise that evening and resume full activity the next day.  A sitz bath (soaking in a warm tub) or bidet is soothing, and can be useful for cleansing the area after bowel movements.     3. To avoid constipation, take two tablespoons of natural wheat bran, natural oat bran, flax, Benefiber or any over the counter fiber supplement and increase your water intake to 7-8 glasses daily.    4. Unless you have been prescribed anorectal medication, do not put anything inside your rectum for two weeks: No suppositories, enemas, fingers, etc.  5. Occasionally, you may have more bleeding than usual after the banding procedure.  This is often from the untreated hemorrhoids rather than the treated one.  Don't be concerned if there is a tablespoon or so of blood.  If there is more blood than this, lie flat with your bottom higher than your head and apply an ice pack to the area. If the bleeding  does not stop within a half an hour or if you feel faint, call our office at [phone] or go to the emergency room.  6. Problems are not common; however, if there is a substantial amount of bleeding, severe pain, chills, fever or difficulty passing urine (very rare) or other problems, you should call us at [phone] or report to the nearest emergency room.  7. Do not stay seated continuously for more than 2-3 hours for a day or two after the procedure.  Tighten your buttock muscles 10-15 times every two hours and take 10-15 deep breaths every 1-2 hours.  Do not spend more than a few minutes on the toilet if you cannot empty your bowel; instead re-visit the toilet at a later time.    Due to recent changes in healthcare laws, you may see the results of your imaging and laboratory studies on MyChart before your provider has had a chance to review them.  We understand that in some cases there may be results that are confusing or concerning to you. Not all laboratory results come back in the same time frame and the provider may be waiting for multiple results in order to interpret others.  Please give [place] 48 hours in order for your provider to thoroughly review all the results before contacting the office for clarification of your results.   Thank you for choosing me and [facility].  [employer]  Cirigliano, D.O.

## 2020-03-11 NOTE — Progress Notes (Signed)
P  Chief Complaint:    Symptomatic Internal Hemorrhoids; Hemorrhoid Band Ligation  GI History: 49 year old male initially seen in the GI clinic in 03/2018 for symptomatic hemorrhoids (endorses rectal itching, leakage, incomplete cleaning after BM, and intermittent BRBPR.  Rare dyschezia. Suboptimal response to appropriate trials of Proctofoam HC, Preparation H, and other topical medications.  Family history notable for sister with Ulcerative Colitis.  Also history of anal fissure, treated with topical NTG 0.125%.  -02/18/2020: Successful banding of the RA hemorrhoid -03/11/2020: Presents for hemorrhoid banding #2   Endoscopic history: -Colonoscopy (01/2020, Dr. Bryan Lemma): Small internal anal fissure, grade 2 hemorrhoids, 2 rectal polyps (path: Tubular adenoma).  Repeat in 7 years  HPI:     Patient is a 49 y.o. malewith a history of symptomatic internal hemorrhoids presenting to the Gastroenterology Clinic for follow-up and ongoing treatment. The patient presents with symptomatic grade 2 hemorrhoids, unresponsive to maximal medical therapy, requesting rubber band ligation of symptomatic hemorrhoidal disease.  No issue with previous hemorrhoid banding.  Had continued topical NTG for small anal fissure for a few more weeks then stopped.  No change in medical or surgical history, medications, allergies, social history since last appointment with me.   Review of systems:     No chest pain, no SOB, no fevers, no urinary sx   Past Medical History:  Diagnosis Date  . Arthritis    R hip  . Herpes   . Insomnia     Patient's surgical history, family medical history, social history, medications and allergies were all reviewed in Epic    Current Outpatient Medications  Medication Sig Dispense Refill  . AMBULATORY NON FORMULARY MEDICATION Place 1 application rectally 2 (two) times daily. Medication Name: Nitroglycerin 0.125% gel. Apply pea size amount to the rectum three times a  day as needed 30 g 3  . traZODone (DESYREL) 100 MG tablet     . valACYclovir (VALTREX) 1000 MG tablet TAKE 1 TABLET(1000 MG) BY MOUTH DAILY 90 tablet 1  . zolpidem (AMBIEN) 5 MG tablet Take 1 tablet (5 mg total) by mouth at bedtime as needed for sleep. (Patient not taking: Reported on 09/24/2019) 15 tablet 1   No current facility-administered medications for this visit.    Physical Exam:     There were no vitals taken for this visit.  GENERAL:  Pleasant male in NAD PSYCH: : Cooperative, normal affect NEURO: Alert and oriented x 3, no focal neurologic deficits Rectal exam: Sensation intact and preserved anal wink.  Grade 2 hemorrhoids noted in RP and LL position.  Previously noted anal fissure has since resolved.  Normal sphincter tone. No palpable mass. No blood on the exam glove. (Chaperone: Lanny Hurst, CMA).   IMPRESSION and PLAN:    #1.  Symptomatic internal hemorrhoids: PROCEDURE NOTE: The patient presents with symptomatic grade 2 hemorrhoids, unresponsive to maximal medical therapy, requesting rubber band ligation of symptomatic hemorrhoidal disease.  All risks, benefits and alternative forms of therapy were described and informed consent was obtained.  In the Left Lateral Decubitus position, anoscopic examination revealed grade 2 hemorrhoids in the RP and LL position(s).  The anorectum was pre-medicated with RectiCare. The decision was made to band the RP internal hemorrhoid, and the [place] was used to perform band ligation without complication.  Digital anorectal examination was then performed to assure proper positioning of the band, and to adjust the banded tissue as required.  The patient was discharged home without pain or other issues.  Dietary and behavioral recommendations were given and along with follow-up instructions.     The following adjunctive treatments were recommended:  -Resume high-fiber diet with fiber supplement (i.e. Citrucel or Benefiber) with  goal for soft stools without straining to have a BM. -Resume adequate fluid intake.  The patient will return in 2-4 for  follow-up and possible additional banding as required. No complications were encountered and the patient tolerated the procedure well.     #2.  Anal fissure -Resolved  #3.  Tubular adenoma -Repeat colonoscopy 2028 for ongoing polyp surveillance      Lavena Bullion ,DO, FACG 03/11/2020, 11:32 AM

## 2020-03-16 DIAGNOSIS — M9905 Segmental and somatic dysfunction of pelvic region: Secondary | ICD-10-CM | POA: Diagnosis not present

## 2020-03-16 DIAGNOSIS — M9903 Segmental and somatic dysfunction of lumbar region: Secondary | ICD-10-CM | POA: Diagnosis not present

## 2020-03-16 DIAGNOSIS — Q6589 Other specified congenital deformities of hip: Secondary | ICD-10-CM | POA: Diagnosis not present

## 2020-03-16 DIAGNOSIS — M5117 Intervertebral disc disorders with radiculopathy, lumbosacral region: Secondary | ICD-10-CM | POA: Diagnosis not present

## 2020-04-16 ENCOUNTER — Ambulatory Visit (INDEPENDENT_AMBULATORY_CARE_PROVIDER_SITE_OTHER): Payer: BC Managed Care – PPO | Admitting: Gastroenterology

## 2020-04-16 ENCOUNTER — Encounter: Payer: Self-pay | Admitting: Gastroenterology

## 2020-04-16 VITALS — BP 116/78 | HR 67 | Ht 69.0 in | Wt 187.0 lb

## 2020-04-16 DIAGNOSIS — K921 Melena: Secondary | ICD-10-CM | POA: Diagnosis not present

## 2020-04-16 DIAGNOSIS — K641 Second degree hemorrhoids: Secondary | ICD-10-CM | POA: Diagnosis not present

## 2020-04-16 DIAGNOSIS — Z86010 Personal history of colonic polyps: Secondary | ICD-10-CM

## 2020-04-16 NOTE — Patient Instructions (Addendum)
HEMORRHOID BANDING PROCEDURE    FOLLOW-UP CARE   1. The procedure you have had should have been relatively painless since the banding of the area involved does not have nerve endings and there is no pain sensation.  The rubber band cuts off the blood supply to the hemorrhoid and the band may fall off as soon as 48 hours after the banding (the band may occasionally be seen in the toilet bowl following a bowel movement). You may notice a temporary feeling of fullness in the rectum which should respond adequately to plain Tylenol or Motrin.  2. Following the banding, avoid strenuous exercise that evening and resume full activity the next day.  A sitz bath (soaking in a warm tub) or bidet is soothing, and can be useful for cleansing the area after bowel movements.     3. To avoid constipation, take two tablespoons of natural wheat bran, natural oat bran, flax, Benefiber or any over the counter fiber supplement and increase your water intake to 7-8 glasses daily.    4. Unless you have been prescribed anorectal medication, do not put anything inside your rectum for two weeks: No suppositories, enemas, fingers, etc.  5. Occasionally, you may have more bleeding than usual after the banding procedure.  This is often from the untreated hemorrhoids rather than the treated one.  Don't be concerned if there is a tablespoon or so of blood.  If there is more blood than this, lie flat with your bottom higher than your head and apply an ice pack to the area. If the bleeding does not stop within a half an hour or if you feel faint, call our office at [phone] or go to the emergency room.  6. Problems are not common; however, if there is a substantial amount of bleeding, severe pain, chills, fever or difficulty passing urine (very rare) or other problems, you should call us at [phone] or report to the nearest emergency room.  7. Do not stay seated continuously for more than 2-3 hours for a day or two  after the procedure.  Tighten your buttock muscles 10-15 times every two hours and take 10-15 deep breaths every 1-2 hours.  Do not spend more than a few minutes on the toilet if you cannot empty your bowel; instead re-visit the toilet at a later time.    Follow up as needed.  It was a pleasure to see you today!  Vito Cirigliano, D.O.  

## 2020-04-16 NOTE — Progress Notes (Signed)
P  Chief Complaint:    Symptomatic Internal Hemorrhoids; Hemorrhoid Band Ligation  GI History: 50 year old male initially seen in the GI clinic in 03/2018 for symptomatic hemorrhoids (endorses rectal itching, leakage, incomplete cleaning after BM, and intermittent BRBPR. Rare dyschezia. Suboptimal response to appropriate trials of Proctofoam HC, Preparation H, and other topical medications.  Family history notable for sister with Ulcerative Colitis.  Also history of anal fissure, treated with topical NTG 0.125%.  -02/18/2020: Successful banding of the RA hemorrhoid -03/11/2020: Successful banding of the RP hemorrhoid  - 04/16/2020: Presents for hemorrhoid banding #3   Endoscopic history: -Colonoscopy (01/2020, Dr. Bryan Lemma): Small internal anal fissure, grade 2 hemorrhoids, 2 rectal polyps (path: Tubular adenoma). Repeat in 7 years  HPI:     Patient is a 50 y.o. malewith a history of symptomatic internal hemorrhoids presenting to the Gastroenterology Clinic for follow-up and ongoing treatment. The patient presents with symptomatic grade 2 hemorrhoids, unresponsive to maximal medical therapy, requesting rubber band ligation of symptomatic hemorrhoidal disease.  Has done well after each hemorrhoid banding.  Pruritus ani has improved.  Still with intermittent BRBPR.   Anal fissure has resolved.  No change in medical or surgical history, medications, allergies, social history since last appointment with me.   Review of systems:     No chest pain, no SOB, no fevers, no urinary sx   Past Medical History:  Diagnosis Date  . Arthritis    R hip  . Herpes   . Insomnia     Patient's surgical history, family medical history, social history, medications and allergies were all reviewed in Epic    Current Outpatient Medications  Medication Sig Dispense Refill  . traZODone (DESYREL) 100 MG tablet as needed.    . valACYclovir (VALTREX) 1000 MG tablet TAKE 1 TABLET(1000 MG) BY  MOUTH DAILY (Patient taking differently: as needed. TAKE 1 TABLET(1000 MG) BY MOUTH DAILY) 90 tablet 1  . zolpidem (AMBIEN) 5 MG tablet Take 1 tablet (5 mg total) by mouth at bedtime as needed for sleep. 15 tablet 1   No current facility-administered medications for this visit.    Physical Exam:     BP 116/78   Pulse 67   Ht 5\' 9"  (1.753 m)   Wt 187 lb (84.8 kg)   BMI 27.62 kg/m   GENERAL:  Pleasant male in NAD PSYCH: : Cooperative, normal affect NEURO: Alert and oriented x 3, no focal neurologic deficits Rectal exam: Sensation intact and preserved anal wink.  Grade 2 hemorrhoid noted in LL position.  No external anal fissures noted. Normal sphincter tone. No palpable mass. No blood on the exam glove. (Chaperone: Curlene Labrum, CMA).   IMPRESSION and PLAN:    #1.  Symptomatic internal hemorrhoids: PROCEDURE NOTE: The patient presents with symptomatic grade 2 hemorrhoids, unresponsive to maximal medical therapy, requesting rubber band ligation of symptomatic hemorrhoidal disease.  All risks, benefits and alternative forms of therapy were described and informed consent was obtained.  In the Left Lateral Decubitus position, anoscopic examination revealed grade 2 hemorrhoids in the LL position(s).  The anorectum was pre-medicated with RectiCare. The decision was made to band the LL internal hemorrhoid, and the [place] was used to perform band ligation without complication.  Digital anorectal examination was then performed to assure proper positioning of the band, and to adjust the banded tissue as required.  The patient was discharged home without pain or other issues.  Dietary and behavioral recommendations were given and along with  follow-up instructions.     The following adjunctive treatments were recommended:  -Resume high-fiber diet with fiber supplement (i.e. Citrucel or Benefiber) with goal for soft stools without straining to have a BM. -Resume adequate fluid  intake.  Did have rectal spasm/discomfort but not frank pain after the procedure. Repeat examination with manipulation of hemorrhoid band and subsequent symptomatic improvement.  Allowed extended recovery in the GI clinic, and symptoms resolved prior to discharge from the office.   The patient will return as needed if any ongoing or recurrence of hemorrhoidal symptoms that could require additional hemorrhoid banding.   #2.Anal fissure -Resolved  #3.Tubular adenoma -Repeat colonoscopy 2028 for ongoing polyp surveillance      Lavena Bullion ,DO, FACG 04/16/2020, 8:40 AM

## 2020-06-16 DIAGNOSIS — M545 Low back pain, unspecified: Secondary | ICD-10-CM | POA: Diagnosis not present

## 2020-06-16 DIAGNOSIS — M5416 Radiculopathy, lumbar region: Secondary | ICD-10-CM | POA: Diagnosis not present

## 2020-06-19 DIAGNOSIS — M5416 Radiculopathy, lumbar region: Secondary | ICD-10-CM | POA: Diagnosis not present

## 2020-06-23 ENCOUNTER — Encounter: Payer: Self-pay | Admitting: Gastroenterology

## 2020-06-23 ENCOUNTER — Other Ambulatory Visit: Payer: Self-pay

## 2020-06-23 ENCOUNTER — Ambulatory Visit (INDEPENDENT_AMBULATORY_CARE_PROVIDER_SITE_OTHER): Payer: BC Managed Care – PPO | Admitting: Gastroenterology

## 2020-06-23 VITALS — BP 120/76 | HR 61 | Ht 68.0 in | Wt 196.2 lb

## 2020-06-23 DIAGNOSIS — K641 Second degree hemorrhoids: Secondary | ICD-10-CM

## 2020-06-23 DIAGNOSIS — Z86010 Personal history of colonic polyps: Secondary | ICD-10-CM

## 2020-06-23 DIAGNOSIS — K921 Melena: Secondary | ICD-10-CM

## 2020-06-23 NOTE — Progress Notes (Signed)
P  Chief Complaint:    Hematochezia, symptomatic hemorrhoids  GI History: 50 year old male initially seen in the GI clinic in 03/2018 for symptomatic hemorrhoids- endorses rectal itching, leakage, incomplete cleaning after BM, and intermittent BRBPR. Rare dyschezia. Suboptimal response to appropriate trials of Proctofoam HC, Preparation H, and other topical medications.  Family history notable for sister with Ulcerative Colitis.  Also history of anal fissure, treated with topical NTG 0.125%.  -02/18/2020: Successful banding of the RAhemorrhoid -03/11/2020: Successful banding of the RP hemorrhoid  - 04/16/2020: Successful banding of the LL hemorrhoid   Endoscopic history: -Colonoscopy (01/2020, Dr. Bryan Lemma): Small internal anal fissure, grade 2 hemorrhoids, 2 rectal polyps (path: Tubular adenoma). Repeat in 7 years  HPI:     Patient is a 50 y.o. male presenting to the Gastroenterology Clinic for follow-up.  Last seen by me on 04/16/2020 for hemorrhoid banding #3.  Has been doing well since then, but recent recurrence of scant BRBPR on tissue paper. No rectal pain, dyschezia, but occasional perirectal itching.   Review of systems:     No chest pain, no SOB, no fevers, no urinary sx   Past Medical History:  Diagnosis Date  . Arthritis    R hip  . Herpes   . Insomnia     Patient's surgical history, family medical history, social history, medications and allergies were all reviewed in Epic    Current Outpatient Medications  Medication Sig Dispense Refill  . b complex vitamins capsule Take 1 capsule by mouth daily as needed.    Marland Kitchen BABY ASPIRIN PO Take 1 tablet by mouth as needed.    . Cholecalciferol (VITAMIN D3 PO) Take 1 tablet by mouth daily as needed.    . Coenzyme Q10 (CO Q 10 PO) Take 1 tablet by mouth as needed.    . Omega-3 Fatty Acids (FISH OIL PO) Take 1 tablet by mouth daily as needed.    . Turmeric (QC TUMERIC COMPLEX PO) Take 1 tablet by mouth daily as  needed.    . valACYclovir (VALTREX) 1000 MG tablet TAKE 1 TABLET(1000 MG) BY MOUTH DAILY (Patient taking differently: as needed. TAKE 1 TABLET(1000 MG) BY MOUTH DAILY) 90 tablet 1   No current facility-administered medications for this visit.    Physical Exam:     BP 120/76   Pulse 61   Ht 5\' 8"  (1.727 m)   Wt 196 lb 4 oz (89 kg)   BMI 29.84 kg/m   GENERAL:  Pleasant male in NAD PSYCH: : Cooperative, normal affect Musculoskeletal:  Normal muscle tone, normal strength NEURO: Alert and oriented x 3, no focal neurologic deficits Rectal exam: Sensation intact and preserved anal wink. No external anal fissures, external hemorrhoids or skin tags. Normal sphincter tone. No palpable mass. No blood on the exam glove.  Anoscopy with inflamed grade 1 hemorrhoid in RP position noninflamed small grade 1 hemorrhoid in LL position.  (Chaperone: Curlene Labrum, CMA).     IMPRESSION and PLAN:    1) Symptomatic internal hemorrhoids: PROCEDURE NOTE: The patient presents with symptomatic hemorrhoids.  Has had some clinical improvement with hemorrhoid banding series, but recent breakthrough of intermittent scant BRBPR.  Exam today with inflamed grade 1 hemorrhoid in RP position and noninflamed small grade 1 hemorrhoid in LL position.  Due to symptomatic recurrence, patient requesting rubber band ligation of symptomatic hemorrhoidal disease. All risks, benefits and alternative forms of therapy were described and informed consent was obtained.  The anorectum was pre-medicated with RectiCare.  The decision was made to band the RP and LL internal hemorrhoid columns, and the [place] was used to perform band ligation without complication. Digital anorectal examination was then performed to assure proper positioning of the band, and to adjust the banded tissue as required.  The patient was discharged home without pain or other issues. Dietary and behavioral recommendations were given and along with  follow-up instructions.    The following adjunctive treatments were recommended:  -Sitz baths as needed -Resume high-fiber diet with fiber supplement (i.e. Citrucel or Benefiber) with goal for soft stools without straining to have a BM. -Resume adequate fluid intake, with at least 64 ounces of water/day -Minimize commode time  The patient will return as needed if any ongoing or recurrence of hemorrhoidal symptoms that could require additional hemorrhoid banding.  2) Tubular adenoma -Repeat colonoscopy 2028 for ongoing polyp surveillance          Lavena Bullion ,DO, FACG 06/23/2020, 8:41 AM

## 2020-06-23 NOTE — Patient Instructions (Signed)
If you are age 50 or older, your body mass index should be between 23-30. Your Body mass index is 29.84 kg/m. If this is out of the aforementioned range listed, please consider follow up with your Primary Care Provider.  If you are age 38 or younger, your body mass index should be between 19-25. Your Body mass index is 29.84 kg/m. If this is out of the aformentioned range listed, please consider follow up with your Primary Care Provider.   HEMORRHOID BANDING PROCEDURE    FOLLOW-UP CARE   1. The procedure you have had should have been relatively painless since the banding of the area involved does not have nerve endings and there is no pain sensation.  The rubber band cuts off the blood supply to the hemorrhoid and the band may fall off as soon as 48 hours after the banding (the band may occasionally be seen in the toilet bowl following a bowel movement). You may notice a temporary feeling of fullness in the rectum which should respond adequately to plain Tylenol or Motrin.  2. Following the banding, avoid strenuous exercise that evening and resume full activity the next day.  A sitz bath (soaking in a warm tub) or bidet is soothing, and can be useful for cleansing the area after bowel movements.     3. To avoid constipation, take two tablespoons of natural wheat bran, natural oat bran, flax, Benefiber or any over the counter fiber supplement and increase your water intake to 7-8 glasses daily.    4. Unless you have been prescribed anorectal medication, do not put anything inside your rectum for two weeks: No suppositories, enemas, fingers, etc.  5. Occasionally, you may have more bleeding than usual after the banding procedure.  This is often from the untreated hemorrhoids rather than the treated one.  Don't be concerned if there is a tablespoon or so of blood.  If there is more blood than this, lie flat with your bottom higher than your head and apply an ice pack to the area. If the bleeding  does not stop within a half an hour or if you feel faint, call our office at [phone] or go to the emergency room.  6. Problems are not common; however, if there is a substantial amount of bleeding, severe pain, chills, fever or difficulty passing urine (very rare) or other problems, you should call us at [phone] or report to the nearest emergency room.  7. Do not stay seated continuously for more than 2-3 hours for a day or two after the procedure.  Tighten your buttock muscles 10-15 times every two hours and take 10-15 deep breaths every 1-2 hours.  Do not spend more than a few minutes on the toilet if you cannot empty your bowel; instead re-visit the toilet at a later time.    Due to recent changes in healthcare laws, you may see the results of your imaging and laboratory studies on MyChart before your provider has had a chance to review them.  We understand that in some cases there may be results that are confusing or concerning to you. Not all laboratory results come back in the same time frame and the provider may be waiting for multiple results in order to interpret others.  Please give [place] 48 hours in order for your provider to thoroughly review all the results before contacting the office for clarification of your results.   Return to the clinic as needed.  Thank you for  choosing me and [facility].  Vito Cirigliano, D.O.

## 2020-07-09 DIAGNOSIS — M25551 Pain in right hip: Secondary | ICD-10-CM | POA: Diagnosis not present

## 2020-07-24 DIAGNOSIS — M25551 Pain in right hip: Secondary | ICD-10-CM | POA: Diagnosis not present

## 2020-10-27 DIAGNOSIS — M5416 Radiculopathy, lumbar region: Secondary | ICD-10-CM | POA: Diagnosis not present

## 2020-11-09 ENCOUNTER — Encounter: Payer: Self-pay | Admitting: Family Medicine

## 2020-11-09 DIAGNOSIS — M545 Low back pain, unspecified: Secondary | ICD-10-CM | POA: Diagnosis not present

## 2020-12-07 ENCOUNTER — Encounter: Payer: BC Managed Care – PPO | Admitting: Family Medicine

## 2021-02-15 ENCOUNTER — Encounter: Payer: BC Managed Care – PPO | Admitting: Family Medicine

## 2021-03-06 DIAGNOSIS — M9903 Segmental and somatic dysfunction of lumbar region: Secondary | ICD-10-CM | POA: Diagnosis not present

## 2021-03-06 DIAGNOSIS — M5117 Intervertebral disc disorders with radiculopathy, lumbosacral region: Secondary | ICD-10-CM | POA: Diagnosis not present

## 2021-03-06 DIAGNOSIS — M9905 Segmental and somatic dysfunction of pelvic region: Secondary | ICD-10-CM | POA: Diagnosis not present

## 2021-03-06 DIAGNOSIS — Q6589 Other specified congenital deformities of hip: Secondary | ICD-10-CM | POA: Diagnosis not present

## 2021-03-12 DIAGNOSIS — M5117 Intervertebral disc disorders with radiculopathy, lumbosacral region: Secondary | ICD-10-CM | POA: Diagnosis not present

## 2021-03-12 DIAGNOSIS — M9903 Segmental and somatic dysfunction of lumbar region: Secondary | ICD-10-CM | POA: Diagnosis not present

## 2021-03-12 DIAGNOSIS — M9905 Segmental and somatic dysfunction of pelvic region: Secondary | ICD-10-CM | POA: Diagnosis not present

## 2021-03-12 DIAGNOSIS — Q6589 Other specified congenital deformities of hip: Secondary | ICD-10-CM | POA: Diagnosis not present

## 2021-03-17 DIAGNOSIS — M9903 Segmental and somatic dysfunction of lumbar region: Secondary | ICD-10-CM | POA: Diagnosis not present

## 2021-03-17 DIAGNOSIS — M5117 Intervertebral disc disorders with radiculopathy, lumbosacral region: Secondary | ICD-10-CM | POA: Diagnosis not present

## 2021-03-17 DIAGNOSIS — M9904 Segmental and somatic dysfunction of sacral region: Secondary | ICD-10-CM | POA: Diagnosis not present

## 2021-03-17 DIAGNOSIS — M9905 Segmental and somatic dysfunction of pelvic region: Secondary | ICD-10-CM | POA: Diagnosis not present

## 2021-03-17 DIAGNOSIS — M9902 Segmental and somatic dysfunction of thoracic region: Secondary | ICD-10-CM | POA: Diagnosis not present

## 2021-03-17 DIAGNOSIS — Q6589 Other specified congenital deformities of hip: Secondary | ICD-10-CM | POA: Diagnosis not present

## 2021-03-19 DIAGNOSIS — M9905 Segmental and somatic dysfunction of pelvic region: Secondary | ICD-10-CM | POA: Diagnosis not present

## 2021-03-19 DIAGNOSIS — M5117 Intervertebral disc disorders with radiculopathy, lumbosacral region: Secondary | ICD-10-CM | POA: Diagnosis not present

## 2021-03-19 DIAGNOSIS — M9902 Segmental and somatic dysfunction of thoracic region: Secondary | ICD-10-CM | POA: Diagnosis not present

## 2021-03-19 DIAGNOSIS — M9904 Segmental and somatic dysfunction of sacral region: Secondary | ICD-10-CM | POA: Diagnosis not present

## 2021-03-19 DIAGNOSIS — M9903 Segmental and somatic dysfunction of lumbar region: Secondary | ICD-10-CM | POA: Diagnosis not present

## 2021-03-19 DIAGNOSIS — Q6589 Other specified congenital deformities of hip: Secondary | ICD-10-CM | POA: Diagnosis not present

## 2021-05-19 ENCOUNTER — Encounter: Payer: Self-pay | Admitting: Family Medicine

## 2021-05-19 ENCOUNTER — Ambulatory Visit (INDEPENDENT_AMBULATORY_CARE_PROVIDER_SITE_OTHER): Payer: BC Managed Care – PPO | Admitting: Family Medicine

## 2021-05-19 VITALS — BP 128/80 | HR 74 | Temp 97.9°F | Resp 16 | Ht 69.0 in | Wt 214.0 lb

## 2021-05-19 DIAGNOSIS — D170 Benign lipomatous neoplasm of skin and subcutaneous tissue of head, face and neck: Secondary | ICD-10-CM

## 2021-05-19 DIAGNOSIS — M7989 Other specified soft tissue disorders: Secondary | ICD-10-CM

## 2021-05-19 DIAGNOSIS — E669 Obesity, unspecified: Secondary | ICD-10-CM | POA: Diagnosis not present

## 2021-05-19 MED ORDER — SEMAGLUTIDE-WEIGHT MANAGEMENT 0.5 MG/0.5ML ~~LOC~~ SOAJ: 0.5000 mg | SUBCUTANEOUS | 0 refills | Status: DC

## 2021-05-19 MED ORDER — SEMAGLUTIDE-WEIGHT MANAGEMENT 0.25 MG/0.5ML ~~LOC~~ SOAJ: 0.2500 mg | SUBCUTANEOUS | 0 refills | Status: DC

## 2021-05-19 MED ORDER — SEMAGLUTIDE-WEIGHT MANAGEMENT 1.7 MG/0.75ML ~~LOC~~ SOAJ: 1.7000 mg | SUBCUTANEOUS | 0 refills | Status: DC

## 2021-05-19 MED ORDER — SEMAGLUTIDE-WEIGHT MANAGEMENT 1 MG/0.5ML ~~LOC~~ SOAJ: 1.0000 mg | SUBCUTANEOUS | 0 refills | Status: DC

## 2021-05-19 MED ORDER — SEMAGLUTIDE-WEIGHT MANAGEMENT 2.4 MG/0.75ML ~~LOC~~ SOAJ: 2.4000 mg | SUBCUTANEOUS | 0 refills | Status: DC

## 2021-05-19 NOTE — Patient Instructions (Signed)
The area on your neck feels like a lipoma. If this starts to bother you, please let me know and we can refer you to a surgeon. ? ?If the armpit gets worse in the next few days, let me know and we can get an ultrasound. Alternatively if it doesn't get better in 3-4 weeks, we can check one then. ? ?Don't fill the medicine if it is too expensive.  ? ?Keep the diet clean and stay active. ? ?Let us know if you need anything. ?

## 2021-05-19 NOTE — Progress Notes (Signed)
Chief Complaint  ?Patient presents with  ? lymph swollen   ?  Here for complains of swollen lymph nodes on left side   ? ? ?Jose [name] is a 51 y.o. male here for a skin complaint. ? ?Duration: 10 days ?Location: L base of neck, having some swelling in his left armpit region as well ?Pruritic? No ?Painful? No ?Drainage? No ?Other associated symptoms: pushing against throat but swallowing, no unexplained wt loss, recent illness, trauma ?Therapies tried thus far: none ? ?Patient is struggled with weight loss.  Diet is fairly healthy overall.  He has made dietary changes in the past, lost 50 pounds, but gained it all back.  He is interested in trying a medication.  He does not have a history of diabetes.  Wants to make sure it does not increase his risk of dementia or stroke. ? ?Past Medical History:  ?Diagnosis Date  ? Arthritis   ? R hip  ? Herpes   ? Insomnia   ? ? ?BP 128/80 (BP Location: Right Arm, Patient Position: Sitting, Cuff Size: Normal)   Pulse 74   Temp 97.9 ?F (36.6 ?C) (Oral)   Resp 16   Ht 5\' 9"  (1.753 m)   Wt 214 lb (97.1 kg)   SpO2 97%   BMI 31.60 kg/m?  ?Gen: awake, alert, appearing stated age ?Lungs: No accessory muscle use ?Skin: 3 x 2 cm firm and rubbery nodule in the supraclavicular region that is freely movable.  No drainage, erythema, TTP, fluctuance, excoriation ?Fullness noted in axillary regions bilaterally suggesting fat deposition.  Slightly more full on the left compared to the right.  There is no tenderness, discrete nodule, or pigmentation change compared to surrounding tissue.  There is no excessive warmth or fluctuance. ?Psych: Age appropriate judgment and insight ? ?Lipoma of neck ? ?Axillary swelling ? ?Obesity (BMI 30-39.9) - Plan: Semaglutide-Weight Management 0.25 MG/0.5ML SOAJ, Semaglutide-Weight Management 0.5 MG/0.5ML SOAJ, Semaglutide-Weight Management 1 MG/0.5ML SOAJ, Semaglutide-Weight Management 1.7 MG/0.75ML SOAJ, Semaglutide-Weight Management 2.4 MG/0.75ML  SOAJ ? ?Reassurance, offered referral to general surgery which she politely declined at this time.  If anything changes he will let me know and I will place referral. ?Tentative reassurance given as well.  He will monitor for the next several weeks.  If it worsens he will let me know sooner and we will order ultrasound of the axillary region.  Alternatively, if it just simply fails to improve over the next few weeks we will order the same imaging. ?Chronic, uncontrolled.  Counseled on diet and exercise.  Start Wegovy 0.25 mg weekly and steadily increase every 4 doses to a higher strength.  He will follow-up in around 5 weeks with his regular PCP. ?The patient voiced understanding and agreement to the plan. ? ?Shelda Pal, DO ?05/19/21 ?4:40 PM ? ?

## 2021-05-20 ENCOUNTER — Telehealth: Payer: Self-pay

## 2021-05-20 ENCOUNTER — Other Ambulatory Visit: Payer: Self-pay | Admitting: Family Medicine

## 2021-05-20 MED ORDER — OZEMPIC (0.25 OR 0.5 MG/DOSE) 2 MG/1.5ML ~~LOC~~ SOPN: PEN_INJECTOR | SUBCUTANEOUS | 1 refills | Status: DC

## 2021-05-20 NOTE — Telephone Encounter (Signed)
Ozempic is only covered for type 2 diabetes. PA not completed because I do not see that he has type 2 diabetes.  ?

## 2021-05-21 NOTE — Telephone Encounter (Signed)
Patient informed ?This patient would like his PCP assistant to call him and move his CPE with PCP sooner than 06/22/21 ?

## 2021-05-21 NOTE — Telephone Encounter (Signed)
Pt called. Appt moved. ?

## 2021-05-27 ENCOUNTER — Encounter: Payer: Self-pay | Admitting: Family Medicine

## 2021-05-27 ENCOUNTER — Ambulatory Visit (INDEPENDENT_AMBULATORY_CARE_PROVIDER_SITE_OTHER): Payer: BC Managed Care – PPO | Admitting: Family Medicine

## 2021-05-27 VITALS — BP 110/80 | HR 75 | Temp 97.6°F | Resp 18 | Ht 69.0 in | Wt 211.8 lb

## 2021-05-27 DIAGNOSIS — Z808 Family history of malignant neoplasm of other organs or systems: Secondary | ICD-10-CM

## 2021-05-27 DIAGNOSIS — D170 Benign lipomatous neoplasm of skin and subcutaneous tissue of head, face and neck: Secondary | ICD-10-CM | POA: Diagnosis not present

## 2021-05-27 DIAGNOSIS — A6001 Herpesviral infection of penis: Secondary | ICD-10-CM

## 2021-05-27 DIAGNOSIS — Z125 Encounter for screening for malignant neoplasm of prostate: Secondary | ICD-10-CM

## 2021-05-27 DIAGNOSIS — E785 Hyperlipidemia, unspecified: Secondary | ICD-10-CM

## 2021-05-27 DIAGNOSIS — Z1159 Encounter for screening for other viral diseases: Secondary | ICD-10-CM | POA: Diagnosis not present

## 2021-05-27 DIAGNOSIS — E669 Obesity, unspecified: Secondary | ICD-10-CM

## 2021-05-27 DIAGNOSIS — Z0000 Encounter for general adult medical examination without abnormal findings: Secondary | ICD-10-CM

## 2021-05-27 LAB — COMPREHENSIVE METABOLIC PANEL
ALT: 113 U/L — ABNORMAL HIGH (ref 0–53)
AST: 65 U/L — ABNORMAL HIGH (ref 0–37)
Albumin: 4.8 g/dL (ref 3.5–5.2)
Alkaline Phosphatase: 49 U/L (ref 39–117)
BUN: 14 mg/dL (ref 6–23)
CO2: 29 mEq/L (ref 19–32)
Calcium: 9.5 mg/dL (ref 8.4–10.5)
Chloride: 102 mEq/L (ref 96–112)
Creatinine, Ser: 1 mg/dL (ref 0.40–1.50)
GFR: 88.46 mL/min (ref >60.00)
Glucose, Bld: 195 mg/dL — ABNORMAL HIGH (ref 70–99)
Potassium: 4.5 mEq/L (ref 3.5–5.1)
Sodium: 138 mEq/L (ref 135–145)
Total Bilirubin: 0.9 mg/dL (ref 0.2–1.2)
Total Protein: 6.7 g/dL (ref 6.0–8.3)

## 2021-05-27 LAB — LIPID PANEL
Cholesterol: 226 mg/dL — ABNORMAL HIGH (ref 0–200)
HDL: 41.9 mg/dL (ref >39.00)
LDL Cholesterol: 152 mg/dL — ABNORMAL HIGH (ref 0–99)
NonHDL: 184.36
Total CHOL/HDL Ratio: 5
Triglycerides: 162 mg/dL — ABNORMAL HIGH (ref 0.0–149.0)
VLDL: 32.4 mg/dL (ref 0.0–40.0)

## 2021-05-27 LAB — CBC WITH DIFFERENTIAL/PLATELET
Basophils Absolute: 0 10*3/uL (ref 0.0–0.1)
Basophils Relative: 0.7 % (ref 0.0–3.0)
Eosinophils Absolute: 0.2 10*3/uL (ref 0.0–0.7)
Eosinophils Relative: 4.3 % (ref 0.0–5.0)
HCT: 44.6 % (ref 39.0–52.0)
Hemoglobin: 15.6 g/dL (ref 13.0–17.0)
Lymphocytes Relative: 31.5 % (ref 12.0–46.0)
Lymphs Abs: 1.8 10*3/uL (ref 0.7–4.0)
MCHC: 35 g/dL (ref 30.0–36.0)
MCV: 93 fl (ref 78.0–100.0)
Monocytes Absolute: 0.4 10*3/uL (ref 0.1–1.0)
Monocytes Relative: 7.6 % (ref 3.0–12.0)
Neutro Abs: 3.1 10*3/uL (ref 1.4–7.7)
Neutrophils Relative %: 55.9 % (ref 43.0–77.0)
Platelets: 210 10*3/uL (ref 150.0–400.0)
RBC: 4.79 Mil/uL (ref 4.22–5.81)
RDW: 13.4 % (ref 11.5–15.5)
WBC: 5.6 10*3/uL (ref 4.0–10.5)

## 2021-05-27 LAB — TSH: TSH: 3.09 u[IU]/mL (ref 0.35–5.50)

## 2021-05-27 LAB — PSA: PSA: 0.44 ng/mL (ref 0.10–4.00)

## 2021-05-27 MED ORDER — VALACYCLOVIR HCL 1 G PO TABS: ORAL_TABLET | ORAL | 1 refills | Status: DC

## 2021-05-27 NOTE — Assessment & Plan Note (Signed)
Encourage heart healthy diet such as MIND or DASH diet, increase exercise, avoid trans fats, simple carbohydrates and processed foods, consider a krill or fish or flaxseed oil cap daily.  °

## 2021-05-27 NOTE — Assessment & Plan Note (Signed)
ghm utd ?Check labs  ?See aVS ? ?

## 2021-05-27 NOTE — Progress Notes (Addendum)
Subjective:   By signing my name below, I, [name], attest that this documentation has been prepared under the direction and in the presence of [name], DO. 05/27/2021    Patient ID: Jose [name], male    DOB: 1970-07-31, 51 y.o.   MRN: [id number]  Chief Complaint  Patient presents with   Annual Exam    Pt states fasting     HPI Patient is in today for a comprehensive physical exam.  He complains of chronic back pain. He is seeing a Psychologist, sport and exercise to manage his symptoms and reports the surgeon recommended a fusion procedure to mange his pain. He is inquiring whether the surgery is necessary. He reports his pain is mainly affecting his ability to complete regular exercise. He will f/u with ortho about surgery.  He is requesting a refill on 1000 mg valtrex PRN.  He is requesting for a comprehensive test for his cholesterol during his lab work this visit.  Lab Results  Component Value Date   CHOL 247 (H) 12/05/2019   HDL 49 12/05/2019   LDLCALC 177 (H) 12/05/2019   TRIG 96 12/05/2019   CHOLHDL 5.0 (H) 12/05/2019   He is not taking ozempic nor wegovy due to his insurance not covering him. He is trying to lose weight. He notes that his eating habits is what is typically causing his weight gain. He reports stress eating. He is maintaining a healthy diet at this time. He was informed of a healthy weight and wellness program to help him lose weight.  He reports having a non-painful lipoma on his left shoulder for the past couple of weeks. He notes after its initial discovery it grew in size slightly but stopped after. He reports no other changes since then. He also reports his father and paternal grandmother has a history of melanoma. He is interested in seeing a dermatologist for screening.  He denies having any fever, new muscle pain, new joint pain, new moles, congestion, sinus pain, sore throat, chest pain, palpations, cough, SOB, wheezing, n/v/d, constipation, blood in  stool, dysuria, frequency, hematuria, or headaches at this time. He has no recent changes to his family medical history.  He is participates in light exercise by playing basketball 4-5x weekly for around 40 minutes.  He has 2 Covid-19 vaccines at this time. He does not have any booster vaccines. He does not remember if he received a flu vaccine this year. He is not interested in receiving it this year.   Past Medical History:  Diagnosis Date   Arthritis    R hip   Herpes    Insomnia     No past surgical history on file.  Family History  Problem Relation Age of Onset   Diabetes Mellitus II Mother    Dementia Father    Diabetes Father    Hypertension Father    Hyperlipidemia Father    Melanoma Father    Skin cancer Paternal Grandmother    Stomach cancer Paternal Grandmother    Melanoma Paternal Grandmother    Heart attack Paternal Uncle    Cancer - Other Paternal Uncle        agent orange    Colon cancer Neg Hx    Esophageal cancer Neg Hx    Rectal cancer Neg Hx     Social History   Socioeconomic History   Marital status: Married    Spouse name: Not on file   Number of children: 3   Years of education:  Not on file   Highest education level: Not on file  Occupational History   Occupation: Patent attorney    Comment: [employer]  Tobacco Use   Smoking status: Never   Smokeless tobacco: Never  Vaping Use   Vaping Use: Never used  Substance and Sexual Activity   Alcohol use: Yes    Comment: ocassionally   Drug use: No   Sexual activity: Yes    Partners: Female  Other Topics Concern   Not on file  Social History Narrative   Exercise some --- basketball 4-5 x a week for 40 min   Social Determinants of Health   Financial Resource Strain: Not on file  Food Insecurity: Not on file  Transportation Needs: Not on file  Physical Activity: Not on file  Stress: Not on file  Social Connections: Not on file  Intimate Partner Violence: Not on file     Outpatient Medications Prior to Visit  Medication Sig Dispense Refill   b complex vitamins capsule Take 1 capsule by mouth daily as needed.     BABY ASPIRIN PO Take 1 tablet by mouth as needed.     Cholecalciferol (VITAMIN D3 PO) Take 1 tablet by mouth daily as needed.     Coenzyme Q10 (CO Q 10 PO) Take 1 tablet by mouth as needed.     Omega-3 Fatty Acids (FISH OIL PO) Take 1 tablet by mouth daily as needed.     Turmeric (QC TUMERIC COMPLEX PO) Take 1 tablet by mouth daily as needed.     Semaglutide,0.25 or 0.'5MG'$ /DOS, (OZEMPIC, 0.25 OR 0.5 MG/DOSE,) 2 MG/1.5ML SOPN Inject 0.25 mg into the skin once a week for 28 days, THEN 0.5 mg once a week for 28 days. 3 mL 1   valACYclovir (VALTREX) 1000 MG tablet TAKE 1 TABLET(1000 MG) BY MOUTH DAILY (Patient taking differently: as needed. TAKE 1 TABLET(1000 MG) BY MOUTH DAILY) 90 tablet 1   No facility-administered medications prior to visit.    No Known Allergies  Review of Systems  Constitutional:  Negative for fever and malaise/fatigue.  HENT:  Negative for congestion, sinus pain and sore throat.   Eyes:  Negative for blurred vision.  Respiratory:  Negative for cough, shortness of breath and wheezing.   Cardiovascular:  Negative for chest pain, palpitations and leg swelling.  Gastrointestinal:  Negative for blood in stool, constipation, diarrhea, nausea and vomiting.  Genitourinary:  Negative for dysuria, frequency and hematuria.  Musculoskeletal:  Positive for back pain. Negative for joint pain and myalgias.  Skin:  Negative for rash.       (-)New moles (+)lipoma on left shoulder  Neurological:  Negative for loss of consciousness and headaches.      Objective:    Physical Exam Vitals and nursing note reviewed.  Constitutional:      General: He is not in acute distress.    Appearance: Normal appearance. He is well-developed. He is not ill-appearing or diaphoretic.  HENT:     Head: Normocephalic and atraumatic.     Right Ear:  Tympanic membrane, ear canal and external ear normal.     Left Ear: Tympanic membrane, ear canal and external ear normal.     Nose: Nose normal.     Mouth/Throat:     Pharynx: No oropharyngeal exudate.  Eyes:     General:        Right eye: No discharge.        Left eye: No discharge.  Extraocular Movements: Extraocular movements intact.     Conjunctiva/sclera: Conjunctivae normal.     Pupils: Pupils are equal, round, and reactive to light.  Neck:     Thyroid: No thyromegaly.     Vascular: No JVD.  Cardiovascular:     Rate and Rhythm: Normal rate and regular rhythm.     Heart sounds: Normal heart sounds. No murmur heard.   No friction rub. No gallop.  Pulmonary:     Effort: Pulmonary effort is normal. No respiratory distress.     Breath sounds: Normal breath sounds. No wheezing or rales.  Chest:     Chest wall: No tenderness.  Abdominal:     General: Bowel sounds are normal. There is no distension.     Palpations: Abdomen is soft. There is no mass.     Tenderness: There is no abdominal tenderness. There is no guarding or rebound.  Musculoskeletal:        General: No tenderness. Normal range of motion.     Cervical back: Normal range of motion and neck supple.     Comments: Worsening back pain with burning in leg Pt will f/u ortho  Lymphadenopathy:     Cervical: No cervical adenopathy.  Skin:    General: Skin is warm and dry.     Coloration: Skin is not pale.     Findings: No erythema or rash.     Comments: Prob lipmona L shoulder  No pain with palpation  Rubbery  Pt feels like it is getting bigger and he feels a knot in the middle   Neurological:     Mental Status: He is alert and oriented to person, place, and time.     Motor: No weakness or abnormal muscle tone.     Gait: Gait normal.     Deep Tendon Reflexes: Reflexes are normal and symmetric. Reflexes normal.  Psychiatric:        Behavior: Behavior normal.        Thought Content: Thought content normal.         Judgment: Judgment normal.    BP 110/80 (BP Location: Right Arm, Patient Position: Sitting, Cuff Size: Normal)    Pulse 75    Temp 97.6 F (36.4 C) (Oral)    Resp 18    Ht '5\' 9"'$  (1.753 m)    Wt 211 lb 12.8 oz (96.1 kg)    SpO2 97%    BMI 31.28 kg/m  Wt Readings from Last 3 Encounters:  05/27/21 211 lb 12.8 oz (96.1 kg)  05/19/21 214 lb (97.1 kg)  06/23/20 196 lb 4 oz (89 kg)    Diabetic Foot Exam - Simple   No data filed    Lab Results  Component Value Date   WBC 6.6 12/05/2019   HGB 16.2 12/05/2019   HCT 46.7 12/05/2019   PLT 280 12/05/2019   GLUCOSE 141 (H) 12/05/2019   CHOL 247 (H) 12/05/2019   TRIG 96 12/05/2019   HDL 49 12/05/2019   LDLCALC 177 (H) 12/05/2019   ALT 24 12/05/2019   AST 20 12/05/2019   NA 139 12/05/2019   K 4.8 12/05/2019   CL 103 12/05/2019   CREATININE 1.07 12/05/2019   BUN 16 12/05/2019   CO2 26 12/05/2019   TSH 2.45 12/05/2019   PSA 0.71 12/05/2019   HGBA1C 5.0 12/05/2019    Lab Results  Component Value Date   TSH 2.45 12/05/2019   Lab Results  Component Value Date   WBC 6.6 12/05/2019  HGB 16.2 12/05/2019   HCT 46.7 12/05/2019   MCV 93.4 12/05/2019   PLT 280 12/05/2019   Lab Results  Component Value Date   NA 139 12/05/2019   K 4.8 12/05/2019   CO2 26 12/05/2019   GLUCOSE 141 (H) 12/05/2019   BUN 16 12/05/2019   CREATININE 1.07 12/05/2019   BILITOT 0.7 12/05/2019   ALKPHOS 52 11/29/2018   AST 20 12/05/2019   ALT 24 12/05/2019   PROT 7.0 12/05/2019   ALBUMIN 4.7 11/29/2018   CALCIUM 9.5 12/05/2019   GFR 75.75 11/29/2018   Lab Results  Component Value Date   CHOL 247 (H) 12/05/2019   Lab Results  Component Value Date   HDL 49 12/05/2019   Lab Results  Component Value Date   LDLCALC 177 (H) 12/05/2019   Lab Results  Component Value Date   TRIG 96 12/05/2019   Lab Results  Component Value Date   CHOLHDL 5.0 (H) 12/05/2019   Lab Results  Component Value Date   HGBA1C 5.0 12/05/2019   Colonoscopy- Last  completed 02/05/2020. Results showed: - Anal fissure found on perianal exam. - Two 2 to 5 mm polyps in the rectum, removed with a cold snare. Resected and retrieved. - Non-bleeding internal hemorrhoids. - The examined portion of the ileum was normal. Otherwise results are normal. Repeat in 5-10 years.  PSA- Last completed 12/05/2019. Results showed 0.71.      Assessment & Plan:   Problem List Items Addressed This Visit       Unprioritized   Dyslipidemia    Encourage heart healthy diet such as MIND or DASH diet, increase exercise, avoid trans fats, simple carbohydrates and processed foods, consider a krill or fish or flaxseed oil cap daily.       Obesity (BMI 30-39.9)    D/w pt diet and exercise Info giving about healthy weight and wellness       Preventative health care - Primary    ghm utd Check labs  See aVS       Relevant Orders   CBC with Differential/Platelet   Comprehensive metabolic panel   Lipid panel   TSH   PSA   Other Visit Diagnoses     Herpes simplex infection of penis       Relevant Medications   valACYclovir (VALTREX) 1000 MG tablet   Hyperlipidemia, unspecified hyperlipidemia type       Relevant Orders   NMR LipoProf noLp+Graph   Lipoma of neck       Relevant Orders   US SOFT TISSUE HEAD & NECK (NON-THYROID)   Need for hepatitis C screening test       Relevant Orders   Hepatitis C antibody   Family hx of melanoma       Relevant Orders   Ambulatory referral to Dermatology        Meds ordered this encounter  Medications   valACYclovir (VALTREX) 1000 MG tablet    Sig: TAKE 1 TABLET(1000 MG) BY MOUTH DAILY    Dispense:  90 tablet    Refill:  1    I, [name], DO, personally preformed the services described in this documentation.  All medical record entries made by the scribe were at my direction and in my presence.  I have reviewed the chart and discharge instructions (if applicable) and agree that the record reflects my  personal performance and is accurate and complete. 05/27/2021   I,[name],acting as a Education administrator for [employer], DO.,have documented all relevant documentation on the behalf of [name], DO,as directed by  [name], DO while in the presence of [name], DO.   [name], DO

## 2021-05-27 NOTE — Patient Instructions (Addendum)
Dr Phylliss Bob at [place] ? ? ? ?Preventive Care 50-50 Years Old, Male ?Preventive care refers to lifestyle choices and visits with your health care provider that can promote health and wellness. Preventive care visits are also called wellness exams. ?What can I expect for my preventive care visit? ?Counseling ?During your preventive care visit, your health care provider may ask about your: ?Medical history, including: ?Past medical problems. ?Family medical history. ?Current health, including: ?Emotional well-being. ?Home life and relationship well-being. ?Sexual activity. ?Lifestyle, including: ?Alcohol, nicotine or tobacco, and drug use. ?Access to firearms. ?Diet, exercise, and sleep habits. ?Safety issues such as seatbelt and bike helmet use. ?Sunscreen use. ?Work and work Statistician. ?Physical exam ?Your health care provider will check your: ?Height and weight. These may be used to calculate your BMI (body mass index). BMI is a measurement that tells if you are at a healthy weight. ?Waist circumference. This measures the distance around your waistline. This measurement also tells if you are at a healthy weight and may help predict your risk of certain diseases, such as type 2 diabetes and high blood pressure. ?Heart rate and blood pressure. ?Body temperature. ?Skin for abnormal spots. ?What immunizations do I need? ?Vaccines are usually given at various ages, according to a schedule. Your health care provider will recommend vaccines for you based on your age, medical history, and lifestyle or other factors, such as travel or where you work. ?What tests do I need? ?Screening ?Your health care provider may recommend screening tests for certain conditions. This may include: ?Lipid and cholesterol levels. ?Diabetes screening. This is done by checking your blood sugar (glucose) after you have not eaten for a while (fasting). ?Hepatitis B test. ?Hepatitis C test. ?HIV (human immunodeficiency virus)  test. ?STI (sexually transmitted infection) testing, if you are at risk. ?Lung cancer screening. ?Prostate cancer screening. ?Colorectal cancer screening. ?Talk with your health care provider about your test results, treatment options, and if necessary, the need for more tests. ?Follow these instructions at home: ?Eating and drinking ? ?Eat a diet that includes fresh fruits and vegetables, whole grains, lean protein, and low-fat dairy products. ?Take vitamin and mineral supplements as recommended by your health care provider. ?Do not drink alcohol if your health care provider tells you not to drink. ?If you drink alcohol: ?Limit how much you have to 0-2 drinks a day. ?Know how much alcohol is in your drink. In the [place] one drink equals one 12 oz bottle of beer (355 mL), one 5 oz glass of wine (148 mL), or one 1? oz glass of hard liquor (44 mL). ?Lifestyle ?Brush your teeth every morning and night with fluoride toothpaste. Floss one time each day. ?Exercise for at least 30 minutes 5 or more days each week. ?Do not use any products that contain nicotine or tobacco. These products include cigarettes, chewing tobacco, and vaping devices, such as e-cigarettes. If you need help quitting, ask your health care provider. ?Do not use drugs. ?If you are sexually active, practice safe sex. Use a condom or other form of protection to prevent STIs. ?Take aspirin only as told by your health care provider. Make sure that you understand how much to take and what form to take. Work with your health care provider to find out whether it is safe and beneficial for you to take aspirin daily. ?Find healthy ways to manage stress, such as: ?Meditation, yoga, or listening to music. ?Journaling. ?Talking to a trusted person. ?Spending time with friends and  family. ?Minimize exposure to UV radiation to reduce your risk of skin cancer. ?Safety ?Always wear your seat belt while driving or riding in a vehicle. ?Do not drive: ?If you have been  drinking alcohol. Do not ride with someone who has been drinking. ?When you are tired or distracted. ?While texting. ?If you have been using any mind-altering substances or drugs. ?Wear a helmet and other protective equipment during sports activities. ?If you have firearms in your house, make sure you follow all gun safety procedures. ?What's next? ?Go to your health care provider once a year for an annual wellness visit. ?Ask your health care provider how often you should have your eyes and teeth checked. ?Stay up to date on all vaccines. ?This information is not intended to replace advice given to you by your health care provider. Make sure you discuss any questions you have with your health care provider. ?Document Revised: 09/02/2020 Document Reviewed: 09/02/2020 ?Elsevier Patient Education ? [place]. ? ?

## 2021-05-27 NOTE — Assessment & Plan Note (Signed)
D/w pt diet and exercise ?Info giving about healthy weight and wellness ? ?

## 2021-05-28 ENCOUNTER — Other Ambulatory Visit: Payer: Self-pay | Admitting: Family Medicine

## 2021-05-28 ENCOUNTER — Other Ambulatory Visit (INDEPENDENT_AMBULATORY_CARE_PROVIDER_SITE_OTHER): Payer: BC Managed Care – PPO

## 2021-05-28 ENCOUNTER — Other Ambulatory Visit: Payer: Self-pay

## 2021-05-28 ENCOUNTER — Ambulatory Visit (HOSPITAL_BASED_OUTPATIENT_CLINIC_OR_DEPARTMENT_OTHER): Payer: BC Managed Care – PPO

## 2021-05-28 DIAGNOSIS — R7989 Other specified abnormal findings of blood chemistry: Secondary | ICD-10-CM

## 2021-05-28 DIAGNOSIS — Z0000 Encounter for general adult medical examination without abnormal findings: Secondary | ICD-10-CM | POA: Diagnosis not present

## 2021-05-28 LAB — HEPATITIS C ANTIBODY
Hepatitis C Ab: NONREACTIVE
SIGNAL TO CUT-OFF: <0.02 (ref <1.00)

## 2021-05-28 LAB — HEMOGLOBIN A1C: Hgb A1c MFr Bld: 7.6 % — ABNORMAL HIGH (ref 4.6–6.5)

## 2021-05-29 LAB — NMR LIPOPROF NOLP+GRAPH
HDL Particle Number: 28.5 umol/L — ABNORMAL LOW (ref >=30.5)
LDL Particle Number: 2213 nmol/L — ABNORMAL HIGH (ref <1000)
LDL Size: 20.4 nm — ABNORMAL LOW (ref >20.5)
LP-IR Score: 81 — ABNORMAL HIGH (ref <=45)
Small LDL Particle Number: 1212 nmol/L — ABNORMAL HIGH (ref <=527)

## 2021-05-31 ENCOUNTER — Other Ambulatory Visit: Payer: Self-pay | Admitting: Family Medicine

## 2021-05-31 ENCOUNTER — Telehealth: Payer: Self-pay

## 2021-05-31 ENCOUNTER — Other Ambulatory Visit: Payer: Self-pay

## 2021-05-31 DIAGNOSIS — E785 Hyperlipidemia, unspecified: Secondary | ICD-10-CM

## 2021-05-31 DIAGNOSIS — E1165 Type 2 diabetes mellitus with hyperglycemia: Secondary | ICD-10-CM

## 2021-05-31 DIAGNOSIS — E1169 Type 2 diabetes mellitus with other specified complication: Secondary | ICD-10-CM

## 2021-05-31 MED ORDER — OZEMPIC (0.25 OR 0.5 MG/DOSE) 2 MG/1.5ML ~~LOC~~ SOPN: 0.2500 mg | PEN_INJECTOR | SUBCUTANEOUS | 0 refills | Status: DC

## 2021-05-31 NOTE — Telephone Encounter (Signed)
Pt called and made his appt for lab work in 3-4 weeks as directed by Dr. Etter Sjogren.  He also wants to pursue Ozempic now that he is diabetic (per pt).  He is also interested in a referral to [facility].  He stated Dr. Nani Ravens had tried to get him on Ozempic before, but insurance would not approve it, but it should now with a diabetic diagnosis. ?

## 2021-06-01 ENCOUNTER — Other Ambulatory Visit: Payer: Self-pay

## 2021-06-01 DIAGNOSIS — E669 Obesity, unspecified: Secondary | ICD-10-CM

## 2021-06-01 NOTE — Telephone Encounter (Signed)
Referral placed.

## 2021-06-02 ENCOUNTER — Encounter: Payer: Self-pay | Admitting: Family Medicine

## 2021-06-03 ENCOUNTER — Telehealth: Payer: Self-pay

## 2021-06-03 ENCOUNTER — Other Ambulatory Visit: Payer: Self-pay

## 2021-06-03 MED ORDER — OZEMPIC (0.25 OR 0.5 MG/DOSE) 2 MG/1.5ML ~~LOC~~ SOPN: 0.2500 mg | PEN_INJECTOR | SUBCUTANEOUS | 0 refills | Status: DC

## 2021-06-03 NOTE — Telephone Encounter (Signed)
PA initiated via Covermymeds; KEY: [id number]. PA approved.  ? ?This request has been approved using information available on the patient's profile. [id number];Review Type:Prior Auth;Coverage Start Date:05/04/2021;Coverage End Date:06/03/2022; ?

## 2021-06-03 NOTE — Addendum Note (Signed)
Addended byDamita Dunnings D on: 06/03/2021 10:45 AM ? ? Modules accepted: Orders ? ?

## 2021-06-09 ENCOUNTER — Encounter (INDEPENDENT_AMBULATORY_CARE_PROVIDER_SITE_OTHER): Payer: Self-pay | Admitting: Family Medicine

## 2021-06-09 ENCOUNTER — Other Ambulatory Visit: Payer: Self-pay

## 2021-06-09 ENCOUNTER — Ambulatory Visit (INDEPENDENT_AMBULATORY_CARE_PROVIDER_SITE_OTHER): Payer: BC Managed Care – PPO | Admitting: Family Medicine

## 2021-06-09 VITALS — BP 116/77 | HR 76 | Temp 98.1°F | Ht 68.0 in | Wt 206.0 lb

## 2021-06-09 DIAGNOSIS — Z6831 Body mass index (BMI) 31.0-31.9, adult: Secondary | ICD-10-CM | POA: Diagnosis not present

## 2021-06-09 DIAGNOSIS — E1165 Type 2 diabetes mellitus with hyperglycemia: Secondary | ICD-10-CM

## 2021-06-09 DIAGNOSIS — R0602 Shortness of breath: Secondary | ICD-10-CM

## 2021-06-09 DIAGNOSIS — E669 Obesity, unspecified: Secondary | ICD-10-CM | POA: Diagnosis not present

## 2021-06-09 DIAGNOSIS — Z1331 Encounter for screening for depression: Secondary | ICD-10-CM | POA: Diagnosis not present

## 2021-06-09 DIAGNOSIS — R5383 Other fatigue: Secondary | ICD-10-CM | POA: Diagnosis not present

## 2021-06-09 DIAGNOSIS — Z9189 Other specified personal risk factors, not elsewhere classified: Secondary | ICD-10-CM

## 2021-06-10 LAB — VITAMIN B12: Vitamin B-12: 1140 pg/mL (ref 232–1245)

## 2021-06-10 LAB — T4, FREE: Free T4: 1.14 ng/dL (ref 0.82–1.77)

## 2021-06-10 LAB — INSULIN, RANDOM: INSULIN: 15.9 u[IU]/mL (ref 2.6–24.9)

## 2021-06-10 LAB — FOLATE: Folate: >20 ng/mL (ref >3.0)

## 2021-06-10 LAB — VITAMIN D 25 HYDROXY (VIT D DEFICIENCY, FRACTURES): Vit D, 25-Hydroxy: 31.6 ng/mL (ref 30.0–100.0)

## 2021-06-10 LAB — T3, FREE: T3, Free: 3.1 pg/mL (ref 2.0–4.4)

## 2021-06-11 NOTE — Progress Notes (Signed)
? ? ? ? ?Chief Complaint:  ? ?OBESITY ?Jose [name] (MR# [id number]) is a 51 y.o. male who presents for evaluation and treatment of obesity and related comorbidities. Current BMI is Body mass index is 31.32 kg/m?Marland Kitchen Jose [name] has been struggling with his weight for many years and has been unsuccessful in either losing weight, maintaining weight loss, or reaching his healthy weight goal. ? ?Jose [name] has been following a lower carbohydrate plan and he wants to continue this. He was referred by Dr. Etter Sjogren to help control his diabetes mellitus and hyperlipidemia. ? ?Jose [name] is currently in the action stage of change and ready to dedicate time achieving and maintaining a healthier weight. Jose [name] is interested in becoming our patient and working on intensive lifestyle modifications including (but not limited to) diet and exercise for weight loss. ? ?Jose [name]'s habits were reviewed today and are as follows: His family eats meals together, his desired weight loss is 51 lbs, he has been heavy most of his life, he started gaining weight at 51 years old, and then again at 87, his heaviest weight ever was 211 pounds, he snacks frequently in the evenings, he skips meals frequently, he is frequently drinking liquids with calories, he has problems with excessive hunger, he frequently eats larger portions than normal, and he struggles with emotional eating. ? ?Depression Screen ?Jose [name]'s Food and Mood (modified PHQ-9) score was 7. ? ? ?  06/09/2021  ?  9:21 AM  ?Depression screen PHQ 2/9  ?Decreased Interest 1  ?Down, Depressed, Hopeless 1  ?PHQ - 2 Score 2  ?Altered sleeping 0  ?Tired, decreased energy 1  ?Change in appetite 3  ?Feeling bad or failure about yourself  1  ?Trouble concentrating 0  ?Moving slowly or fidgety/restless 0  ?Suicidal thoughts 0  ?PHQ-9 Score 7  ?Difficult doing work/chores Somewhat difficult  ? ?Subjective:  ? ?1. Other fatigue ?Jose [name] admits to daytime somnolence and admits to waking up still tired. Patient has  a history of symptoms of daytime fatigue, morning fatigue, and morning headache. Jose [name] generally gets 7 or 8 hours of sleep per night, and states that he has difficulty falling asleep. Snoring is present. Apneic episodes are not present. Epworth Sleepiness Score is 7.  ? ?2. SOB (shortness of breath) on exertion ?Jose [name] notes increasing shortness of breath with exercising and seems to be worsening over time with weight gain. He notes getting out of breath sooner with activity than he used to. This has not gotten worse recently. Jose [name] denies shortness of breath at rest or orthopnea. ? ?3. Type 2 diabetes mellitus with hyperglycemia, without long-term current use of insulin ([place]) ?Jose [name]'s recent A1c was elevated at 7.6, and is uncontrolled. He is not on medications to treat, but he would like to improve with diet, exercise, and weight loss. He did not bring his blood sugar log today. ? ?4. At risk for heart disease ?Jose [name] is at higher than average risk for cardiovascular disease due to obesity. ? ?Assessment/Plan:  ? ?1. Other fatigue ?Jose [name] does feel that his weight is causing his energy to be lower than it should be. Fatigue may be related to obesity, depression or many other causes. Labs will be ordered, and in the meanwhile, Jose [name] will focus on self care including making healthy food choices, increasing physical activity and focusing on stress reduction. ? ?- EKG 12-Lead ?- Folate ?- Insulin, random ?- T3, free ?- T4, free ?- Vitamin B12 ?- VITAMIN D 25 Hydroxy (Vit-D Deficiency, Fractures) ? ?2. SOB (  shortness of breath) on exertion ?Jose [name] does feel that he gets out of breath more easily that he used to when he exercises. [name]'s shortness of breath appears to be obesity related and exercise induced. He has agreed to work on weight loss and gradually increase exercise to treat his exercise induced shortness of breath. Will continue to monitor closely. ? ?3. Type 2 diabetes mellitus with  hyperglycemia, without long-term current use of insulin ([name]) ?Jose [name] will start his journaling plan, and we will follow up at his next visit. ? ?4. Screening for depression ?Jose [name] had a positive depression screening. Depression is commonly associated with obesity and often results in emotional eating behaviors. We will monitor this closely and work on CBT to help improve the non-hunger eating patterns. Referral to Psychology may be required if no improvement is seen as he continues in our clinic. ? ?5. At risk for heart disease ?Jose [name] was given approximately 30 minutes of coronary artery disease prevention counseling today. He is 51 y.o. male and has risk factors for heart disease including obesity. We discussed intensive lifestyle modifications today with an emphasis on specific weight loss instructions and strategies. ? ?Repetitive spaced learning was employed today to elicit superior memory formation and behavioral change.  ? ?6. Obesity (BMI 30-39.9)Current BMI 31.4 ?Jose [name] is currently in the action stage of change and his goal is to continue with weight loss efforts. I recommend Jose [name] begin the structured treatment plan as follows: ? ?He has agreed to keeping a food journal and adhering to recommended goals of 1300-1600 calories and 100 grams of protein daily. ? ?Exercise goals: No exercise has been prescribed for now, while we concentrate on nutritional changes. ? ?Behavioral modification strategies: keeping a strict food journal. ? ?He was informed of the importance of frequent follow-up visits to maximize his success with intensive lifestyle modifications for his multiple health conditions. He was informed we would discuss his lab results at his next visit unless there is a critical issue that needs to be addressed sooner. Jose [name] agreed to keep his next visit at the agreed upon time to discuss these results. ? ?Objective:  ? ?Blood pressure 116/77, pulse 76, temperature 98.1 ?F (36.7 ?C), height  '5\' 8"'$  (1.727 m), weight 206 lb (93.4 kg), SpO2 95 %. Body mass index is 31.32 kg/m?. ? ?EKG: Normal sinus rhythm, rate 63 BPM ? ?Indirect Calorimeter completed today shows a VO2 of 302 and a REE of 2088.  His calculated basal metabolic rate is 6433 thus his basal metabolic rate is better than expected. ? ?General: Cooperative, alert, well developed, in no acute distress. ?HEENT: Conjunctivae and lids unremarkable. ?Cardiovascular: Regular rhythm.  ?Lungs: Normal work of breathing. ?Neurologic: No focal deficits.  ? ?Lab Results  ?Component Value Date  ? CREATININE 1.00 05/27/2021  ? BUN 14 05/27/2021  ? NA 138 05/27/2021  ? K 4.5 05/27/2021  ? CL 102 05/27/2021  ? CO2 29 05/27/2021  ? ?Lab Results  ?Component Value Date  ? ALT 113 (H) 05/27/2021  ? AST 65 (H) 05/27/2021  ? ALKPHOS 49 05/27/2021  ? BILITOT 0.9 05/27/2021  ? ?Lab Results  ?Component Value Date  ? HGBA1C 7.6 (H) 05/28/2021  ? HGBA1C 5.0 12/05/2019  ? HGBA1C 5.2 08/04/2011  ? ?Lab Results  ?Component Value Date  ? INSULIN 15.9 06/09/2021  ? ?Lab Results  ?Component Value Date  ? TSH 3.09 05/27/2021  ? ?Lab Results  ?Component Value Date  ? CHOL 226 (H) 05/27/2021  ? HDL  41.90 05/27/2021  ? LDLCALC 152 (H) 05/27/2021  ? TRIG 162.0 (H) 05/27/2021  ? CHOLHDL 5 05/27/2021  ? ?Lab Results  ?Component Value Date  ? WBC 5.6 05/27/2021  ? HGB 15.6 05/27/2021  ? HCT 44.6 05/27/2021  ? MCV 93.0 05/27/2021  ? PLT 210.0 05/27/2021  ? ?No results found for: IRON, TIBC, FERRITIN ? ?Attestation Statements:  ? ?Reviewed by clinician on day of visit: allergies, medications, problem list, medical history, surgical history, family history, social history, and previous encounter notes. ? ? ?I, Trixie Dredge, am acting as transcriptionist for Dennard Nip, MD. ? ?I have reviewed the above documentation for accuracy and completeness, and I agree with the above. - Dennard Nip, MD ? ? ?

## 2021-06-22 ENCOUNTER — Encounter: Payer: BC Managed Care – PPO | Admitting: Family Medicine

## 2021-06-23 ENCOUNTER — Ambulatory Visit (INDEPENDENT_AMBULATORY_CARE_PROVIDER_SITE_OTHER): Payer: BC Managed Care – PPO | Admitting: Family Medicine

## 2021-06-23 ENCOUNTER — Encounter (INDEPENDENT_AMBULATORY_CARE_PROVIDER_SITE_OTHER): Payer: Self-pay | Admitting: Family Medicine

## 2021-06-23 VITALS — BP 124/83 | HR 60 | Temp 97.8°F | Ht 68.0 in | Wt 199.0 lb

## 2021-06-23 DIAGNOSIS — E1169 Type 2 diabetes mellitus with other specified complication: Secondary | ICD-10-CM | POA: Diagnosis not present

## 2021-06-23 DIAGNOSIS — Z9189 Other specified personal risk factors, not elsewhere classified: Secondary | ICD-10-CM

## 2021-06-23 DIAGNOSIS — E559 Vitamin D deficiency, unspecified: Secondary | ICD-10-CM | POA: Diagnosis not present

## 2021-06-23 DIAGNOSIS — K760 Fatty (change of) liver, not elsewhere classified: Secondary | ICD-10-CM

## 2021-06-23 DIAGNOSIS — Z6830 Body mass index (BMI) 30.0-30.9, adult: Secondary | ICD-10-CM

## 2021-06-23 DIAGNOSIS — E1165 Type 2 diabetes mellitus with hyperglycemia: Secondary | ICD-10-CM

## 2021-06-23 DIAGNOSIS — E785 Hyperlipidemia, unspecified: Secondary | ICD-10-CM

## 2021-06-23 DIAGNOSIS — E669 Obesity, unspecified: Secondary | ICD-10-CM

## 2021-06-23 MED ORDER — METFORMIN HCL 500 MG PO TABS: 500.0000 mg | ORAL_TABLET | Freq: Every day | ORAL | 0 refills | Status: DC

## 2021-06-24 ENCOUNTER — Ambulatory Visit (HOSPITAL_BASED_OUTPATIENT_CLINIC_OR_DEPARTMENT_OTHER): Payer: BC Managed Care – PPO

## 2021-06-24 ENCOUNTER — Other Ambulatory Visit: Payer: BC Managed Care – PPO

## 2021-06-24 ENCOUNTER — Ambulatory Visit: Payer: BC Managed Care – PPO | Admitting: Family Medicine

## 2021-06-25 NOTE — Progress Notes (Signed)
? ? ? ?Chief Complaint:  ? ?OBESITY ?Demarkus is here to discuss his progress with his obesity treatment plan along with follow-up of his obesity related diagnoses. Kai is on keeping a food journal and adhering to recommended goals of 1300-1600 calories and 100 grams of protein daily and states he is following his eating plan approximately 96% of the time. Kaileb states he is doing cardio and strengthening for 30-60 minutes 6 times per week. ? ?Today's visit was #: 2 ?Starting weight: 206 lbs ?Starting date: 06/09/2021 ?Today's weight: 199 lbs ?Today's date: 06/23/2021 ?Total lbs lost to date: 7 ?Total lbs lost since last in-office visit: 7 ? 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 Did a 3 day fast during the last 2 weeks. He may be interested in the Vegetarian/plant based. He is averaging 1274 calories (included 2 days of fast). Average protein is 93 grams per day. Increased intensity of activity over the last few weeks.  ? ?Subjective:  ? ?1. Hyperlipidemia associated with type 2 diabetes mellitus ([place]) ?Jordan's LDL is 152, triglycerides 162, and HDL 42, and total cholesterol is 226. He is not on statin. His 10 year ASCVD risk score is of 8.1% (optimal 1.9%), he would like to wait on statin.  ? ?2. Type 2 diabetes mellitus with hyperglycemia, without long-term current use of insulin ([place]) ?Reese notes both his parents has a history diabetes mellitus. His A1c is 7.6 and insulin 15.9. He has Ozempic but not taking it as PA took a long time to be approved.  ? ?3. Vitamin D deficiency ?Guinn is not on prescription Vitamin D, but he is on OTC Vitamin D. Last Vit D level was of 36.6. He notes some fatigue.  ? ?4. NAFLD (nonalcoholic fatty liver disease) ?Ketrick's LFTs shows ALT 113 and AST 65, and ALK phos is within normal limits with his PCP mid March. His Hep C was negative.  ? ?5. At risk for diabetes mellitus ?Pritesh is at higher than average risk for  developing diabetes due to his obesity. ? ?Assessment/Plan:  ? ?1. Hyperlipidemia associated with type 2 diabetes mellitus ([place]) ?We will follow up on FLP in 3 months. Modified intensity statin was discussed with Rodman Key today.  ? ?2. Type 2 diabetes mellitus with hyperglycemia, without long-term current use of insulin ([place]) ?Jeven agreed to start metformin 500 mg PO daily, with a 90 day supply with no refills.  ? ?- metFORMIN (GLUCOPHAGE) 500 MG tablet; Take 1 tablet (500 mg total) by mouth daily with breakfast.  Dispense: 90 tablet; Refill: 0 ? ?3. Vitamin D deficiency ?Desi is to increase his OTC Vitamin D to 5,000 IU daily. He will follow-up for routine testing of Vitamin D, at least 2-3 times per year to avoid over-replacement. ? ?4. NAFLD (nonalcoholic fatty liver disease) ?We will follow up on Demarcus's labs in 3 months.  ? ?5. At risk for diabetes mellitus ?Kelley was given approximately 15 minutes of diabetic education and counseling today. We discussed intensive lifestyle modifications today with an emphasis on weight loss as well as increasing exercise and decreasing simple carbohydrates in his diet. We also reviewed medication options with an emphasis on risk versus benefits of those discussed. ? ?Repetitive spaced learning was employed today to elicit superior memory formation and behavioral change. ? ?6. Obesity with current BMI of 30.3 ?Eoghan is currently in the action stage of change. As such, his goal is to continue with weight loss efforts. He has agreed  to keeping a food journal and adhering to recommended goals of 1450-1700 calories and 100+ grams of protein daily.  ? ?Exercise goals: All adults should avoid inactivity. Some physical activity is better than none, and adults who participate in any amount of physical activity gain some health benefits. Add in resistance, increased intensity with the goal of 300 minutes.  ? ?Behavioral modification strategies: increasing lean protein intake,  meal planning and cooking strategies, and keeping healthy foods in the home. ? ?Lynden has agreed to follow-up with our clinic in 2 to 3 weeks. He was informed of the importance of frequent follow-up visits to maximize his success with intensive lifestyle modifications for his multiple health conditions.  ? ?Objective:  ? ?Blood pressure 124/83, pulse 60, temperature 97.8 ?F (36.6 ?C), height _0  (1.727 m), weight 199 lb (90.3 kg), SpO2 99 %. ?Body mass index is 30.26 kg/m?. ? ?General: Cooperative, alert, well developed, in no acute distress. ?HEENT: Conjunctivae and lids unremarkable. ?Cardiovascular: Regular rhythm.  ?Lungs: Normal work of breathing. ?Neurologic: No focal deficits.  ? ?Lab Results  ?Component Value Date  ? CREATININE 1.00 05/27/2021  ? BUN 14 05/27/2021  ? NA 138 05/27/2021  ? K 4.5 05/27/2021  ? CL 102 05/27/2021  ? CO2 29 05/27/2021  ? ?Lab Results  ?Component Value Date  ? ALT 113 (H) 05/27/2021  ? AST 65 (H) 05/27/2021  ? ALKPHOS 49 05/27/2021  ? BILITOT 0.9 05/27/2021  ? ?Lab Results  ?Component Value Date  ? HGBA1C 7.6 (H) 05/28/2021  ? HGBA1C 5.0 12/05/2019  ? HGBA1C 5.2 08/04/2011  ? ?Lab Results  ?Component Value Date  ? INSULIN 15.9 06/09/2021  ? ?Lab Results  ?Component Value Date  ? TSH 3.09 05/27/2021  ? ?Lab Results  ?Component Value Date  ? CHOL 226 (H) 05/27/2021  ? HDL 41.90 05/27/2021  ? LDLCALC 152 (H) 05/27/2021  ? TRIG 162.0 (H) 05/27/2021  ? CHOLHDL 5 05/27/2021  ? ?Lab Results  ?Component Value Date  ? VD25OH 31.6 06/09/2021  ? ?Lab Results  ?Component Value Date  ? WBC 5.6 05/27/2021  ? HGB 15.6 05/27/2021  ? HCT 44.6 05/27/2021  ? MCV 93.0 05/27/2021  ? PLT 210.0 05/27/2021  ? ?No results found for: IRON, TIBC, FERRITIN ? ?Attestation Statements:  ? ?Reviewed by clinician on day of visit: allergies, medications, problem list, medical history, surgical history, family history, social history, and previous encounter notes. ? ? ?I, Trixie Dredge, am acting as  transcriptionist for Coralie Common, MD. ? ?I have reviewed the above documentation for accuracy and completeness, and I agree with the above. Coralie Common, MD ? ? ?

## 2021-06-28 ENCOUNTER — Telehealth: Payer: Self-pay | Admitting: *Deleted

## 2021-06-28 ENCOUNTER — Other Ambulatory Visit: Payer: BC Managed Care – PPO

## 2021-06-28 DIAGNOSIS — E1165 Type 2 diabetes mellitus with hyperglycemia: Secondary | ICD-10-CM

## 2021-06-28 DIAGNOSIS — E1169 Type 2 diabetes mellitus with other specified complication: Secondary | ICD-10-CM

## 2021-06-28 NOTE — Progress Notes (Signed)
Pt came in for labs today but labs are not due until June. Labs reordered and appt rescheduled for 08/30/21. ?

## 2021-06-28 NOTE — Addendum Note (Signed)
Addended by: Kelle Darting A on: 06/28/2021 08:02 AM ? ? Modules accepted: Orders ? ?

## 2021-06-28 NOTE — Telephone Encounter (Signed)
Pt came in today for lab visit and was 2 months early. Orders were for comp, a1c and lipid panel.  Pt is asking to have lipid panel changed to NMR profile for upcoming lab visit. His last check on 3/9 was for NMR.  Ok to change standard lipid panel to NMR? ?

## 2021-06-29 NOTE — Telephone Encounter (Addendum)
Correct, he was too early and I rescheduled him for 08/30/21.  Initially he just had a standard lipid panel in future order and I wanted to make sure it was ok to add the NMR. I have now added the NMR to future orders. Thank you! ?

## 2021-06-29 NOTE — Telephone Encounter (Signed)
Carollee Herter, [name], DO  You 16 hours ago (4:38 PM)  ? ?Its really too soon to do a nother nmr ----  I'd wait at least 2 months from last check   ? ?

## 2021-06-29 NOTE — Addendum Note (Signed)
Addended by: Kelle Darting A on: 06/29/2021 09:01 AM ? ? Modules accepted: Orders ? ?

## 2021-07-14 ENCOUNTER — Encounter (INDEPENDENT_AMBULATORY_CARE_PROVIDER_SITE_OTHER): Payer: Self-pay | Admitting: Physician Assistant

## 2021-07-14 ENCOUNTER — Ambulatory Visit (INDEPENDENT_AMBULATORY_CARE_PROVIDER_SITE_OTHER): Payer: BC Managed Care – PPO | Admitting: Physician Assistant

## 2021-07-14 VITALS — BP 119/80 | HR 60 | Temp 97.6°F | Ht 68.0 in | Wt 198.0 lb

## 2021-07-14 DIAGNOSIS — E1165 Type 2 diabetes mellitus with hyperglycemia: Secondary | ICD-10-CM | POA: Diagnosis not present

## 2021-07-14 DIAGNOSIS — Z6831 Body mass index (BMI) 31.0-31.9, adult: Secondary | ICD-10-CM

## 2021-07-14 DIAGNOSIS — Z6830 Body mass index (BMI) 30.0-30.9, adult: Secondary | ICD-10-CM

## 2021-07-14 DIAGNOSIS — Z7985 Long-term (current) use of injectable non-insulin antidiabetic drugs: Secondary | ICD-10-CM | POA: Diagnosis not present

## 2021-07-14 DIAGNOSIS — E669 Obesity, unspecified: Secondary | ICD-10-CM

## 2021-07-14 MED ORDER — METFORMIN HCL 500 MG PO TABS: 500.0000 mg | ORAL_TABLET | Freq: Every day | ORAL | 0 refills | Status: DC

## 2021-07-19 NOTE — Progress Notes (Signed)
? ? ? ?Chief Complaint:  ? ?OBESITY ?Zakariyah is here to discuss his progress with his obesity treatment plan along with follow-up of his obesity related diagnoses. Fishel is on keeping a food journal and adhering to recommended goals of 1450-1700 calories and 100 grams of protein and the [place] and states he is following his eating plan approximately 90% of the time. Carrington states he is running, bike and weights for 60 plus minutes 5-6 times per week. ? ?Today's visit was #: 3 ?Starting weight: 206 lbs ?Starting date: 06/09/2021 ?Today's weight: 198 lbs ?Today's date: 07/14/2021 ?Total lbs lost to date: 8 lbs ?Total lbs lost since last in-office visit: 1 lb ? ?Interim History: Hogan tried Vegetarian for a couple of days and decided he wanted to lose weight quicker and changed to keto for a few days, then changed back to Vegetarian. He is not journaling consistently. When he is , he is averaging close to 2,000 calories and 70-80 grams of protein.  ? ?Subjective:  ? ?1. Type 2 diabetes mellitus with hyperglycemia, without long-term current use of insulin ([place]) ?Amun is on Metformin 500 mg once daily. His last last A1C was 7.6. He is also checked prescribed Ozempic 0.25 mg weekly but is not taking it as he prefers to not take medications.  ? ?Assessment/Plan:  ? ?1. Type 2 diabetes mellitus with hyperglycemia, without long-term current use of insulin ([place]) ?We will refill Metformin for 1 month with no refills. Good blood sugar control is important to decrease the likelihood of diabetic complications such as nephropathy, neuropathy, limb loss, blindness, coronary artery disease, and death. Intensive lifestyle modification including diet, exercise and weight loss are the first line of treatment for diabetes.  ? ?- metFORMIN (GLUCOPHAGE) 500 MG tablet; Take 1 tablet (500 mg total) by mouth daily with breakfast.  Dispense: 90 tablet; Refill: 0 ? ?2. Obesity with current BMI of 30.11 ?Sang is currently in  the action stage of change. As such, his goal is to continue with weight loss efforts. He has agreed to keeping a food journal and adhering to recommended goals of 1450-1700 calories and 100 grams of protein.  ? ?Nicanor was encouraged to eat meats and journal consistently.  ? ?Exercise goals:  As is.  ? ?Behavioral modification strategies: meal planning and cooking strategies, keeping healthy foods in the home, and keeping a strict food journal. ? ?Brendan has agreed to follow-up with our clinic in 4 weeks. He was informed of the importance of frequent follow-up visits to maximize his success with intensive lifestyle modifications for his multiple health conditions.  ? ?Objective:  ? ?Blood pressure 119/80, pulse 60, temperature 97.6 ?F (36.4 ?C), height '5\' 8"'$  (1.727 m), weight 198 lb (89.8 kg), SpO2 100 %. ?Body mass index is 30.11 kg/m?. ? ?General: Cooperative, alert, well developed, in no acute distress. ?HEENT: Conjunctivae and lids unremarkable. ?Cardiovascular: Regular rhythm.  ?Lungs: Normal work of breathing. ?Neurologic: No focal deficits.  ? ?Lab Results  ?Component Value Date  ? CREATININE 1.00 05/27/2021  ? BUN 14 05/27/2021  ? NA 138 05/27/2021  ? K 4.5 05/27/2021  ? CL 102 05/27/2021  ? CO2 29 05/27/2021  ? ?Lab Results  ?Component Value Date  ? ALT 113 (H) 05/27/2021  ? AST 65 (H) 05/27/2021  ? ALKPHOS 49 05/27/2021  ? BILITOT 0.9 05/27/2021  ? ?Lab Results  ?Component Value Date  ? HGBA1C 7.6 (H) 05/28/2021  ? HGBA1C 5.0 12/05/2019  ? HGBA1C 5.2 08/04/2011  ? ?  Lab Results  ?Component Value Date  ? INSULIN 15.9 06/09/2021  ? ?Lab Results  ?Component Value Date  ? TSH 3.09 05/27/2021  ? ?Lab Results  ?Component Value Date  ? CHOL 226 (H) 05/27/2021  ? HDL 41.90 05/27/2021  ? LDLCALC 152 (H) 05/27/2021  ? TRIG 162.0 (H) 05/27/2021  ? CHOLHDL 5 05/27/2021  ? ?Lab Results  ?Component Value Date  ? VD25OH 31.6 06/09/2021  ? ?Lab Results  ?Component Value Date  ? WBC 5.6 05/27/2021  ? HGB 15.6 05/27/2021   ? HCT 44.6 05/27/2021  ? MCV 93.0 05/27/2021  ? PLT 210.0 05/27/2021  ? ?No results found for: IRON, TIBC, FERRITIN ? ?Attestation Statements:  ? ?Reviewed by clinician on day of visit: allergies, medications, problem list, medical history, surgical history, family history, social history, and previous encounter notes. ? ?Time spent on visit including pre-visit chart review and post-visit care and charting was 30 minutes.  ? ?I, Tonye Pearson, am acting as Location manager for [employer], PA-C. ? ?I have reviewed the above documentation for accuracy and completeness, and I agree with the above. Abby Potash, PA-C ? ?

## 2021-07-27 ENCOUNTER — Telehealth: Payer: Self-pay | Admitting: Family Medicine

## 2021-07-27 DIAGNOSIS — A6001 Herpesviral infection of penis: Secondary | ICD-10-CM

## 2021-07-27 NOTE — Telephone Encounter (Signed)
Pt is needing a 33msupply with 3 refills. Pharmacy stated this request was faxed 4/26.  ? ?Medication: valACYclovir (VALTREX) 1000 MG tablet ? ?Has the patient contacted their pharmacy? Yes.   ? ? ?[facility]  ?

## 2021-07-28 MED ORDER — VALACYCLOVIR HCL 1 G PO TABS: ORAL_TABLET | ORAL | 1 refills | Status: DC

## 2021-07-28 NOTE — Telephone Encounter (Signed)
Refill sent.

## 2021-08-12 ENCOUNTER — Ambulatory Visit (INDEPENDENT_AMBULATORY_CARE_PROVIDER_SITE_OTHER): Payer: BC Managed Care – PPO | Admitting: Family Medicine

## 2021-08-13 DIAGNOSIS — M9905 Segmental and somatic dysfunction of pelvic region: Secondary | ICD-10-CM | POA: Diagnosis not present

## 2021-08-13 DIAGNOSIS — M5117 Intervertebral disc disorders with radiculopathy, lumbosacral region: Secondary | ICD-10-CM | POA: Diagnosis not present

## 2021-08-13 DIAGNOSIS — Q6589 Other specified congenital deformities of hip: Secondary | ICD-10-CM | POA: Diagnosis not present

## 2021-08-13 DIAGNOSIS — M9903 Segmental and somatic dysfunction of lumbar region: Secondary | ICD-10-CM | POA: Diagnosis not present

## 2021-08-16 DIAGNOSIS — M5117 Intervertebral disc disorders with radiculopathy, lumbosacral region: Secondary | ICD-10-CM | POA: Diagnosis not present

## 2021-08-16 DIAGNOSIS — M9904 Segmental and somatic dysfunction of sacral region: Secondary | ICD-10-CM | POA: Diagnosis not present

## 2021-08-16 DIAGNOSIS — Q6589 Other specified congenital deformities of hip: Secondary | ICD-10-CM | POA: Diagnosis not present

## 2021-08-16 DIAGNOSIS — M9902 Segmental and somatic dysfunction of thoracic region: Secondary | ICD-10-CM | POA: Diagnosis not present

## 2021-08-16 DIAGNOSIS — M9903 Segmental and somatic dysfunction of lumbar region: Secondary | ICD-10-CM | POA: Diagnosis not present

## 2021-08-16 DIAGNOSIS — M9905 Segmental and somatic dysfunction of pelvic region: Secondary | ICD-10-CM | POA: Diagnosis not present

## 2021-08-18 DIAGNOSIS — M9902 Segmental and somatic dysfunction of thoracic region: Secondary | ICD-10-CM | POA: Diagnosis not present

## 2021-08-18 DIAGNOSIS — Q6589 Other specified congenital deformities of hip: Secondary | ICD-10-CM | POA: Diagnosis not present

## 2021-08-18 DIAGNOSIS — M9903 Segmental and somatic dysfunction of lumbar region: Secondary | ICD-10-CM | POA: Diagnosis not present

## 2021-08-18 DIAGNOSIS — M9904 Segmental and somatic dysfunction of sacral region: Secondary | ICD-10-CM | POA: Diagnosis not present

## 2021-08-18 DIAGNOSIS — M5117 Intervertebral disc disorders with radiculopathy, lumbosacral region: Secondary | ICD-10-CM | POA: Diagnosis not present

## 2021-08-18 DIAGNOSIS — M9905 Segmental and somatic dysfunction of pelvic region: Secondary | ICD-10-CM | POA: Diagnosis not present

## 2021-08-23 ENCOUNTER — Other Ambulatory Visit: Payer: Self-pay | Admitting: Family Medicine

## 2021-08-23 DIAGNOSIS — M9905 Segmental and somatic dysfunction of pelvic region: Secondary | ICD-10-CM | POA: Diagnosis not present

## 2021-08-23 DIAGNOSIS — Q6589 Other specified congenital deformities of hip: Secondary | ICD-10-CM | POA: Diagnosis not present

## 2021-08-23 DIAGNOSIS — M9903 Segmental and somatic dysfunction of lumbar region: Secondary | ICD-10-CM | POA: Diagnosis not present

## 2021-08-23 DIAGNOSIS — M9904 Segmental and somatic dysfunction of sacral region: Secondary | ICD-10-CM | POA: Diagnosis not present

## 2021-08-23 DIAGNOSIS — M5117 Intervertebral disc disorders with radiculopathy, lumbosacral region: Secondary | ICD-10-CM | POA: Diagnosis not present

## 2021-08-23 DIAGNOSIS — M9902 Segmental and somatic dysfunction of thoracic region: Secondary | ICD-10-CM | POA: Diagnosis not present

## 2021-08-27 DIAGNOSIS — M9903 Segmental and somatic dysfunction of lumbar region: Secondary | ICD-10-CM | POA: Diagnosis not present

## 2021-08-27 DIAGNOSIS — M9904 Segmental and somatic dysfunction of sacral region: Secondary | ICD-10-CM | POA: Diagnosis not present

## 2021-08-27 DIAGNOSIS — Q6589 Other specified congenital deformities of hip: Secondary | ICD-10-CM | POA: Diagnosis not present

## 2021-08-27 DIAGNOSIS — M5117 Intervertebral disc disorders with radiculopathy, lumbosacral region: Secondary | ICD-10-CM | POA: Diagnosis not present

## 2021-08-27 DIAGNOSIS — M9902 Segmental and somatic dysfunction of thoracic region: Secondary | ICD-10-CM | POA: Diagnosis not present

## 2021-08-27 DIAGNOSIS — M9905 Segmental and somatic dysfunction of pelvic region: Secondary | ICD-10-CM | POA: Diagnosis not present

## 2021-08-30 ENCOUNTER — Other Ambulatory Visit: Payer: Self-pay | Admitting: Family Medicine

## 2021-08-30 ENCOUNTER — Other Ambulatory Visit (INDEPENDENT_AMBULATORY_CARE_PROVIDER_SITE_OTHER): Payer: BC Managed Care – PPO

## 2021-08-30 DIAGNOSIS — E1169 Type 2 diabetes mellitus with other specified complication: Secondary | ICD-10-CM

## 2021-08-30 DIAGNOSIS — E785 Hyperlipidemia, unspecified: Secondary | ICD-10-CM | POA: Diagnosis not present

## 2021-08-30 DIAGNOSIS — E1165 Type 2 diabetes mellitus with hyperglycemia: Secondary | ICD-10-CM | POA: Diagnosis not present

## 2021-08-30 LAB — HEMOGLOBIN A1C: Hgb A1c MFr Bld: 6.2 % (ref 4.6–6.5)

## 2021-08-30 LAB — COMPREHENSIVE METABOLIC PANEL
ALT: 92 U/L — ABNORMAL HIGH (ref 0–53)
AST: 55 U/L — ABNORMAL HIGH (ref 0–37)
Albumin: 4.6 g/dL (ref 3.5–5.2)
Alkaline Phosphatase: 55 U/L (ref 39–117)
BUN: 13 mg/dL (ref 6–23)
CO2: 26 mEq/L (ref 19–32)
Calcium: 9.5 mg/dL (ref 8.4–10.5)
Chloride: 102 mEq/L (ref 96–112)
Creatinine, Ser: 1.07 mg/dL (ref 0.40–1.50)
GFR: 81.41 mL/min (ref >60.00)
Glucose, Bld: 150 mg/dL — ABNORMAL HIGH (ref 70–99)
Potassium: 4.3 mEq/L (ref 3.5–5.1)
Sodium: 139 mEq/L (ref 135–145)
Total Bilirubin: 0.8 mg/dL (ref 0.2–1.2)
Total Protein: 6.6 g/dL (ref 6.0–8.3)

## 2021-08-30 LAB — LIPID PANEL
Cholesterol: 219 mg/dL — ABNORMAL HIGH (ref 0–200)
HDL: 43.8 mg/dL (ref >39.00)
NonHDL: 174.76
Total CHOL/HDL Ratio: 5
Triglycerides: 216 mg/dL — ABNORMAL HIGH (ref 0.0–149.0)
VLDL: 43.2 mg/dL — ABNORMAL HIGH (ref 0.0–40.0)

## 2021-08-30 LAB — LDL CHOLESTEROL, DIRECT: Direct LDL: 167 mg/dL

## 2021-08-31 ENCOUNTER — Telehealth: Payer: Self-pay | Admitting: Family Medicine

## 2021-08-31 ENCOUNTER — Other Ambulatory Visit: Payer: Self-pay

## 2021-08-31 LAB — NMR LIPOPROF NOLP+GRAPH
HDL Particle Number: 30.2 umol/L — ABNORMAL LOW (ref >=30.5)
LDL Particle Number: 1967 nmol/L — ABNORMAL HIGH (ref <1000)
LDL Size: 20.8 nm (ref >20.5)
LP-IR Score: 86 — ABNORMAL HIGH (ref <=45)
Small LDL Particle Number: 631 nmol/L — ABNORMAL HIGH (ref <=527)

## 2021-08-31 MED ORDER — ROSUVASTATIN CALCIUM 20 MG PO TABS: 20.0000 mg | ORAL_TABLET | Freq: Every day | ORAL | 2 refills | Status: DC

## 2021-08-31 MED ORDER — METFORMIN HCL 500 MG PO TABS: 500.0000 mg | ORAL_TABLET | Freq: Two times a day (BID) | ORAL | 2 refills | Status: DC

## 2021-08-31 NOTE — Telephone Encounter (Signed)
Rx sent in

## 2021-08-31 NOTE — Telephone Encounter (Signed)
Patient states he was informed that a statin rx would be prescribed (Crestor maybe)  Patient would like to change his pharmacy to [address]

## 2021-09-01 DIAGNOSIS — M9903 Segmental and somatic dysfunction of lumbar region: Secondary | ICD-10-CM | POA: Diagnosis not present

## 2021-09-01 DIAGNOSIS — M5117 Intervertebral disc disorders with radiculopathy, lumbosacral region: Secondary | ICD-10-CM | POA: Diagnosis not present

## 2021-09-01 DIAGNOSIS — Q6589 Other specified congenital deformities of hip: Secondary | ICD-10-CM | POA: Diagnosis not present

## 2021-09-01 DIAGNOSIS — M9905 Segmental and somatic dysfunction of pelvic region: Secondary | ICD-10-CM | POA: Diagnosis not present

## 2021-09-02 ENCOUNTER — Other Ambulatory Visit: Payer: Self-pay

## 2021-09-02 MED ORDER — ROSUVASTATIN CALCIUM 40 MG PO TABS: 40.0000 mg | ORAL_TABLET | Freq: Every day | ORAL | 2 refills | Status: DC

## 2021-09-03 DIAGNOSIS — M5117 Intervertebral disc disorders with radiculopathy, lumbosacral region: Secondary | ICD-10-CM | POA: Diagnosis not present

## 2021-09-03 DIAGNOSIS — M9904 Segmental and somatic dysfunction of sacral region: Secondary | ICD-10-CM | POA: Diagnosis not present

## 2021-09-03 DIAGNOSIS — M9905 Segmental and somatic dysfunction of pelvic region: Secondary | ICD-10-CM | POA: Diagnosis not present

## 2021-09-03 DIAGNOSIS — M9903 Segmental and somatic dysfunction of lumbar region: Secondary | ICD-10-CM | POA: Diagnosis not present

## 2021-09-03 DIAGNOSIS — M9902 Segmental and somatic dysfunction of thoracic region: Secondary | ICD-10-CM | POA: Diagnosis not present

## 2021-09-03 DIAGNOSIS — Q6589 Other specified congenital deformities of hip: Secondary | ICD-10-CM | POA: Diagnosis not present

## 2021-09-13 DIAGNOSIS — M9905 Segmental and somatic dysfunction of pelvic region: Secondary | ICD-10-CM | POA: Diagnosis not present

## 2021-09-13 DIAGNOSIS — M9903 Segmental and somatic dysfunction of lumbar region: Secondary | ICD-10-CM | POA: Diagnosis not present

## 2021-09-13 DIAGNOSIS — M9906 Segmental and somatic dysfunction of lower extremity: Secondary | ICD-10-CM | POA: Diagnosis not present

## 2021-09-13 DIAGNOSIS — M50821 Other cervical disc disorders at C4-C5 level: Secondary | ICD-10-CM | POA: Diagnosis not present

## 2021-09-13 DIAGNOSIS — Q6589 Other specified congenital deformities of hip: Secondary | ICD-10-CM | POA: Diagnosis not present

## 2021-09-13 DIAGNOSIS — M5117 Intervertebral disc disorders with radiculopathy, lumbosacral region: Secondary | ICD-10-CM | POA: Diagnosis not present

## 2021-09-14 DIAGNOSIS — M5459 Other low back pain: Secondary | ICD-10-CM | POA: Diagnosis not present

## 2021-09-14 DIAGNOSIS — M5416 Radiculopathy, lumbar region: Secondary | ICD-10-CM | POA: Diagnosis not present

## 2021-09-14 DIAGNOSIS — M4316 Spondylolisthesis, lumbar region: Secondary | ICD-10-CM | POA: Diagnosis not present

## 2021-09-22 DIAGNOSIS — M4316 Spondylolisthesis, lumbar region: Secondary | ICD-10-CM | POA: Diagnosis not present

## 2021-09-22 DIAGNOSIS — M5416 Radiculopathy, lumbar region: Secondary | ICD-10-CM | POA: Diagnosis not present

## 2021-10-04 DIAGNOSIS — M9902 Segmental and somatic dysfunction of thoracic region: Secondary | ICD-10-CM | POA: Diagnosis not present

## 2021-10-04 DIAGNOSIS — M9905 Segmental and somatic dysfunction of pelvic region: Secondary | ICD-10-CM | POA: Diagnosis not present

## 2021-10-04 DIAGNOSIS — M9904 Segmental and somatic dysfunction of sacral region: Secondary | ICD-10-CM | POA: Diagnosis not present

## 2021-10-04 DIAGNOSIS — Q6589 Other specified congenital deformities of hip: Secondary | ICD-10-CM | POA: Diagnosis not present

## 2021-10-04 DIAGNOSIS — M9903 Segmental and somatic dysfunction of lumbar region: Secondary | ICD-10-CM | POA: Diagnosis not present

## 2021-10-04 DIAGNOSIS — M9906 Segmental and somatic dysfunction of lower extremity: Secondary | ICD-10-CM | POA: Diagnosis not present

## 2021-10-04 DIAGNOSIS — M5117 Intervertebral disc disorders with radiculopathy, lumbosacral region: Secondary | ICD-10-CM | POA: Diagnosis not present

## 2021-10-12 DIAGNOSIS — M5117 Intervertebral disc disorders with radiculopathy, lumbosacral region: Secondary | ICD-10-CM | POA: Diagnosis not present

## 2021-10-12 DIAGNOSIS — M9906 Segmental and somatic dysfunction of lower extremity: Secondary | ICD-10-CM | POA: Diagnosis not present

## 2021-10-12 DIAGNOSIS — M9902 Segmental and somatic dysfunction of thoracic region: Secondary | ICD-10-CM | POA: Diagnosis not present

## 2021-10-12 DIAGNOSIS — M9905 Segmental and somatic dysfunction of pelvic region: Secondary | ICD-10-CM | POA: Diagnosis not present

## 2021-10-12 DIAGNOSIS — Q6589 Other specified congenital deformities of hip: Secondary | ICD-10-CM | POA: Diagnosis not present

## 2021-10-12 DIAGNOSIS — M9904 Segmental and somatic dysfunction of sacral region: Secondary | ICD-10-CM | POA: Diagnosis not present

## 2021-10-12 DIAGNOSIS — M9903 Segmental and somatic dysfunction of lumbar region: Secondary | ICD-10-CM | POA: Diagnosis not present

## 2021-10-13 ENCOUNTER — Other Ambulatory Visit: Payer: Self-pay | Admitting: Orthopaedic Surgery

## 2021-10-13 DIAGNOSIS — Q762 Congenital spondylolisthesis: Secondary | ICD-10-CM

## 2021-10-13 DIAGNOSIS — M545 Low back pain, unspecified: Secondary | ICD-10-CM | POA: Diagnosis not present

## 2021-10-15 DIAGNOSIS — M5416 Radiculopathy, lumbar region: Secondary | ICD-10-CM | POA: Diagnosis not present

## 2021-10-15 DIAGNOSIS — M48062 Spinal stenosis, lumbar region with neurogenic claudication: Secondary | ICD-10-CM | POA: Diagnosis not present

## 2021-10-15 DIAGNOSIS — M4316 Spondylolisthesis, lumbar region: Secondary | ICD-10-CM | POA: Diagnosis not present

## 2021-10-25 ENCOUNTER — Ambulatory Visit
Admission: RE | Admit: 2021-10-25 | Discharge: 2021-10-25 | Disposition: A | Discharge status: Home / Self Care | Payer: BC Managed Care – PPO | Source: Ambulatory Visit | Attending: Orthopaedic Surgery | Admitting: Orthopaedic Surgery

## 2021-10-25 DIAGNOSIS — Q762 Congenital spondylolisthesis: Secondary | ICD-10-CM

## 2021-10-25 DIAGNOSIS — M545 Low back pain, unspecified: Secondary | ICD-10-CM | POA: Diagnosis not present

## 2021-10-27 ENCOUNTER — Encounter (INDEPENDENT_AMBULATORY_CARE_PROVIDER_SITE_OTHER): Payer: Self-pay

## 2021-11-01 ENCOUNTER — Other Ambulatory Visit: Payer: Self-pay

## 2021-11-01 ENCOUNTER — Encounter: Payer: Self-pay | Admitting: Family Medicine

## 2021-11-01 MED ORDER — SEMAGLUTIDE (1 MG/DOSE) 4 MG/3ML ~~LOC~~ SOPN: 1.0000 mg | PEN_INJECTOR | SUBCUTANEOUS | 1 refills | Status: DC

## 2021-11-02 DIAGNOSIS — M5116 Intervertebral disc disorders with radiculopathy, lumbar region: Secondary | ICD-10-CM | POA: Diagnosis not present

## 2021-11-12 ENCOUNTER — Telehealth: Payer: Self-pay | Admitting: Family Medicine

## 2021-11-12 NOTE — Telephone Encounter (Signed)
Medication:   Semaglutide, 1 MG/DOSE, 4 MG/3ML SOPN [[id number]]   Has the patient contacted their pharmacy? No. (If no, request that the patient contact the pharmacy for the refill.) (If yes, when and what did the pharmacy advise?)  Preferred Pharmacy (with phone number or street name):   [facility] [address] [place] [address] P: [phone]  Agent: Please be advised that RX refills may take up to 3 business days. We ask that you follow-up with your pharmacy.

## 2021-11-12 NOTE — Telephone Encounter (Signed)
Do we need to increase the dose?

## 2021-11-15 MED ORDER — SEMAGLUTIDE (1 MG/DOSE) 4 MG/3ML ~~LOC~~ SOPN: 1.0000 mg | PEN_INJECTOR | SUBCUTANEOUS | 1 refills | Status: DC

## 2021-11-16 MED ORDER — SEMAGLUTIDE-WEIGHT MANAGEMENT 1.7 MG/0.75ML ~~LOC~~ SOAJ: 1.7000 mg | SUBCUTANEOUS | 1 refills | Status: DC

## 2021-11-16 NOTE — Telephone Encounter (Signed)
Rx sent 

## 2021-11-16 NOTE — Addendum Note (Signed)
Addended by: Sanda Linger on: 11/16/2021 09:13 AM   Modules accepted: Orders

## 2021-11-25 DIAGNOSIS — M5116 Intervertebral disc disorders with radiculopathy, lumbar region: Secondary | ICD-10-CM | POA: Diagnosis not present

## 2021-11-25 DIAGNOSIS — M545 Low back pain, unspecified: Secondary | ICD-10-CM | POA: Diagnosis not present

## 2021-11-26 ENCOUNTER — Other Ambulatory Visit: Payer: Self-pay | Admitting: Family Medicine

## 2021-11-26 MED ORDER — ROSUVASTATIN CALCIUM 40 MG PO TABS: 40.0000 mg | ORAL_TABLET | Freq: Every day | ORAL | 1 refills | Status: DC

## 2021-12-03 ENCOUNTER — Other Ambulatory Visit: Payer: Self-pay | Admitting: Family Medicine

## 2021-12-03 ENCOUNTER — Telehealth: Payer: Self-pay | Admitting: Family Medicine

## 2021-12-03 DIAGNOSIS — A6001 Herpesviral infection of penis: Secondary | ICD-10-CM

## 2021-12-03 MED ORDER — ROSUVASTATIN CALCIUM 40 MG PO TABS: 40.0000 mg | ORAL_TABLET | Freq: Every day | ORAL | 1 refills | Status: DC

## 2021-12-03 NOTE — Telephone Encounter (Signed)
Bobbie from [facility] is calling on behalf of patient to request 90 day supply with 3 refills of rosuvastatin (CRESTOR) 20 MG tablet for mail delivery.  Advised patient has been prescribed 40 mg, but I didn't see 20 mg. Jolayne Haines said she was advised 20 mg.   Please send to [facility]  [address] [place] [address] [phone]; [phone]

## 2021-12-03 NOTE — Telephone Encounter (Signed)
Jose [name], [name]  09/02/2021  4:51 PM EDT Back to Top    Pt viewed via Mychart. Rx sent   [name], DO  09/01/2021 11:01 AM EDT     Increase crestor'40mg'$  #30  2 refills ------  has improved some but LP-IR has inc slightly  Recheck 3 months --- with ov   Agree- Pt should be on '40mg'$ .

## 2021-12-13 DIAGNOSIS — M4317 Spondylolisthesis, lumbosacral region: Secondary | ICD-10-CM | POA: Diagnosis not present

## 2021-12-13 DIAGNOSIS — M549 Dorsalgia, unspecified: Secondary | ICD-10-CM | POA: Diagnosis not present

## 2021-12-13 DIAGNOSIS — G8929 Other chronic pain: Secondary | ICD-10-CM | POA: Diagnosis not present

## 2021-12-13 DIAGNOSIS — M4316 Spondylolisthesis, lumbar region: Secondary | ICD-10-CM | POA: Diagnosis not present

## 2021-12-13 DIAGNOSIS — M5136 Other intervertebral disc degeneration, lumbar region: Secondary | ICD-10-CM | POA: Diagnosis not present

## 2021-12-20 DIAGNOSIS — M5116 Intervertebral disc disorders with radiculopathy, lumbar region: Secondary | ICD-10-CM | POA: Diagnosis not present

## 2022-01-21 DIAGNOSIS — M48061 Spinal stenosis, lumbar region without neurogenic claudication: Secondary | ICD-10-CM | POA: Diagnosis not present

## 2022-01-21 DIAGNOSIS — Z01818 Encounter for other preprocedural examination: Secondary | ICD-10-CM | POA: Diagnosis not present

## 2022-01-21 DIAGNOSIS — E119 Type 2 diabetes mellitus without complications: Secondary | ICD-10-CM | POA: Diagnosis not present

## 2022-01-31 DIAGNOSIS — Z23 Encounter for immunization: Secondary | ICD-10-CM | POA: Diagnosis not present

## 2022-01-31 DIAGNOSIS — Z7984 Long term (current) use of oral hypoglycemic drugs: Secondary | ICD-10-CM | POA: Diagnosis not present

## 2022-01-31 DIAGNOSIS — Z79899 Other long term (current) drug therapy: Secondary | ICD-10-CM | POA: Diagnosis not present

## 2022-01-31 DIAGNOSIS — E1165 Type 2 diabetes mellitus with hyperglycemia: Secondary | ICD-10-CM | POA: Diagnosis not present

## 2022-02-01 ENCOUNTER — Ambulatory Visit (INDEPENDENT_AMBULATORY_CARE_PROVIDER_SITE_OTHER): Payer: BC Managed Care – PPO | Admitting: Family Medicine

## 2022-02-01 ENCOUNTER — Encounter: Payer: Self-pay | Admitting: Family Medicine

## 2022-02-01 VITALS — BP 118/80 | HR 61 | Temp 97.7°F | Resp 12 | Ht 69.0 in | Wt 206.2 lb

## 2022-02-01 DIAGNOSIS — F419 Anxiety disorder, unspecified: Secondary | ICD-10-CM | POA: Insufficient documentation

## 2022-02-01 MED ORDER — LORAZEPAM 0.5 MG PO TABS: 0.5000 mg | ORAL_TABLET | Freq: Three times a day (TID) | ORAL | 1 refills | Status: DC | PRN

## 2022-02-01 MED ORDER — ESCITALOPRAM OXALATE 10 MG PO TABS: 10.0000 mg | ORAL_TABLET | Freq: Every day | ORAL | 1 refills | Status: DC

## 2022-02-01 NOTE — Assessment & Plan Note (Signed)
Start lexapro 10 mg and refill lorazapam for prn F/u 1 month or sooner prn  Pt wants to hold of on counselor at this time

## 2022-02-01 NOTE — Patient Instructions (Signed)

## 2022-02-01 NOTE — Progress Notes (Signed)
Subjective:   By signing my name below, I, Jose [name], attest that this documentation has been prepared under the direction and in the presence of Jose Held, DO. 02/01/2022     Patient ID: Jose [name], male    DOB: [birth year], 51 y.o.   MRN: [id number]  Chief Complaint  Patient presents with   Anxiety    Anxiety Symptoms include nervous/anxious behavior. Patient reports no chest pain, dizziness, insomnia, nausea, palpitations, shortness of breath or suicidal ideas.     Patient is in today for a office visit.  He complains of stress and anxiety. Most of his stress and anxiety comes from work. Other stressors come from financial, home life, and his upcomming surgery. He has stress more times than not from work. He also has difficulty sleeping at night. He has used lorazepam in the past to help him sleep. He denies any thoughts of hurting himself or others. He has not tried counseling and is interested.  He continues following up with his endocrinologist regularly and reports his blood sugars measured 363 while in their office.  Lab Results  Component Value Date   HGBA1C 6.2 08/30/2021    Past Medical History:  Diagnosis Date   Arthritis    R hip   Constipation    Diabetes (HCC)    Fatigue    Herpes    Insomnia    Prediabetes    SOB (shortness of breath) on exertion    Swallowing difficulty     No past surgical history on file.  Family History  Problem Relation Age of Onset   Diabetes Mother    Diabetes Mellitus II Mother    Dementia Father    Diabetes Father    Hypertension Father    Hyperlipidemia Father    Melanoma Father    Skin cancer Paternal Grandmother    Stomach cancer Paternal Grandmother    Melanoma Paternal Grandmother    Heart attack Paternal Uncle    Cancer - Other Paternal Uncle        agent orange    Colon cancer Neg Hx    Esophageal cancer Neg Hx    Rectal cancer Neg Hx     Social History   Socioeconomic History    Marital status: Married    Spouse name: Jose [name]   Number of children: 3   Years of education: Not on file   Highest education level: Not on file  Occupational History   Occupation: Patent attorney    Comment: [employer]  Tobacco Use   Smoking status: Never   Smokeless tobacco: Never  Vaping Use   Vaping Use: Never used  Substance and Sexual Activity   Alcohol use: Yes    Comment: ocassionally   Drug use: No   Sexual activity: Yes    Partners: Female  Other Topics Concern   Not on file  Social History Narrative   Exercise some --- basketball 4-5 x a week for 40 min   Social Determinants of Health   Financial Resource Strain: Not on file  Food Insecurity: Not on file  Transportation Needs: Not on file  Physical Activity: Not on file  Stress: Not on file  Social Connections: Not on file  Intimate Partner Violence: Not on file    Outpatient Medications Prior to Visit  Medication Sig Dispense Refill   b complex vitamins capsule Take 1 capsule by mouth daily as needed.     BABY ASPIRIN PO Take 1  tablet by mouth as needed.     Cholecalciferol (VITAMIN D3 PO) Take 1 tablet by mouth daily as needed.     Coenzyme Q10 (CO Q 10 PO) Take 1 tablet by mouth as needed.     metFORMIN (GLUCOPHAGE) 500 MG tablet Take 1 tablet (500 mg total) by mouth 2 (two) times daily with a meal. (Patient taking differently: Take 1,000 mg by mouth 2 (two) times daily with a meal.) 180 tablet 1   Omega-3 Fatty Acids (FISH OIL PO) Take 1 tablet by mouth daily as needed.     rosuvastatin (CRESTOR) 40 MG tablet Take 1 tablet (40 mg total) by mouth daily. 90 tablet 1   Turmeric (QC TUMERIC COMPLEX PO) Take 1 tablet by mouth daily as needed.     valACYclovir (VALTREX) 1000 MG tablet Take 1 tablet (1,000 mg total) by mouth daily. 90 tablet 1   LORazepam (ATIVAN) 0.5 MG tablet Take 0.5 mg by mouth every 8 (eight) hours as needed for anxiety. (Patient not taking: Reported on 02/01/2022)      Semaglutide, 1 MG/DOSE, 4 MG/3ML SOPN Inject 1 mg as directed once a week. 3 mL 1   Semaglutide-Weight Management 1.7 MG/0.75ML SOAJ Inject 1.7 mg into the skin once a week. 3 mL 1   No facility-administered medications prior to visit.    No Known Allergies  Review of Systems  Constitutional:  Negative for chills, fever and malaise/fatigue.  HENT:  Negative for congestion and hearing loss.   Eyes:  Negative for discharge.  Respiratory:  Negative for cough, sputum production and shortness of breath.   Cardiovascular:  Negative for chest pain, palpitations and leg swelling.  Gastrointestinal:  Negative for abdominal pain, blood in stool, constipation, diarrhea, heartburn, nausea and vomiting.  Genitourinary:  Negative for dysuria, frequency, hematuria and urgency.  Musculoskeletal:  Negative for back pain, falls and myalgias.  Skin:  Negative for rash.  Neurological:  Negative for dizziness, sensory change, loss of consciousness, weakness and headaches.  Endo/Heme/Allergies:  Negative for environmental allergies. Does not bruise/bleed easily.  Psychiatric/Behavioral:  Negative for depression and suicidal ideas. The patient is nervous/anxious. The patient does not have insomnia.        Objective:    Physical Exam Vitals and nursing note reviewed.  Constitutional:      General: He is not in acute distress.    Appearance: Normal appearance. He is not ill-appearing.  HENT:     Head: Normocephalic and atraumatic.     Right Ear: External ear normal.     Left Ear: External ear normal.  Eyes:     Extraocular Movements: Extraocular movements intact.     Pupils: Pupils are equal, round, and reactive to light.  Cardiovascular:     Rate and Rhythm: Normal rate and regular rhythm.     Heart sounds: Normal heart sounds. No murmur heard.    No gallop.  Pulmonary:     Effort: Pulmonary effort is normal. No respiratory distress.     Breath sounds: Normal breath sounds. No wheezing or rales.   Skin:    General: Skin is warm and dry.  Neurological:     Mental Status: He is alert and oriented to person, place, and time.  Psychiatric:        Mood and Affect: Mood is anxious. Mood is not depressed or elated. Affect is not labile, blunt, flat, angry, tearful or inappropriate.        Speech: Speech normal.  Behavior: Behavior normal.        Thought Content: Thought content normal.        Judgment: Judgment normal.     BP 118/80 (BP Location: Right Arm, Cuff Size: Normal)   Pulse 61   Temp 97.7 F (36.5 C) (Oral)   Resp 12   Ht '5\' 9"'$  (1.753 m)   Wt 206 lb 3.2 oz (93.5 kg)   SpO2 96%   BMI 30.45 kg/m  Wt Readings from Last 3 Encounters:  02/01/22 206 lb 3.2 oz (93.5 kg)  07/14/21 198 lb (89.8 kg)  06/23/21 199 lb (90.3 kg)    Diabetic Foot Exam - Simple   No data filed    Lab Results  Component Value Date   WBC 5.6 05/27/2021   HGB 15.6 05/27/2021   HCT 44.6 05/27/2021   PLT 210.0 05/27/2021   GLUCOSE 150 (H) 08/30/2021   CHOL 219 (H) 08/30/2021   TRIG 216.0 (H) 08/30/2021   HDL 43.80 08/30/2021   LDLDIRECT 167.0 08/30/2021   LDLCALC 152 (H) 05/27/2021   ALT 92 (H) 08/30/2021   AST 55 (H) 08/30/2021   NA 139 08/30/2021   K 4.3 08/30/2021   CL 102 08/30/2021   CREATININE 1.07 08/30/2021   BUN 13 08/30/2021   CO2 26 08/30/2021   TSH 3.09 05/27/2021   PSA 0.44 05/27/2021   HGBA1C 6.2 08/30/2021    Lab Results  Component Value Date   TSH 3.09 05/27/2021   Lab Results  Component Value Date   WBC 5.6 05/27/2021   HGB 15.6 05/27/2021   HCT 44.6 05/27/2021   MCV 93.0 05/27/2021   PLT 210.0 05/27/2021   Lab Results  Component Value Date   NA 139 08/30/2021   K 4.3 08/30/2021   CO2 26 08/30/2021   GLUCOSE 150 (H) 08/30/2021   BUN 13 08/30/2021   CREATININE 1.07 08/30/2021   BILITOT 0.8 08/30/2021   ALKPHOS 55 08/30/2021   AST 55 (H) 08/30/2021   ALT 92 (H) 08/30/2021   PROT 6.6 08/30/2021   ALBUMIN 4.6 08/30/2021   CALCIUM 9.5  08/30/2021   GFR 81.41 08/30/2021   Lab Results  Component Value Date   CHOL 219 (H) 08/30/2021   Lab Results  Component Value Date   HDL 43.80 08/30/2021   Lab Results  Component Value Date   LDLCALC 152 (H) 05/27/2021   Lab Results  Component Value Date   TRIG 216.0 (H) 08/30/2021   Lab Results  Component Value Date   CHOLHDL 5 08/30/2021   Lab Results  Component Value Date   HGBA1C 6.2 08/30/2021       Assessment & Plan:   Problem List Items Addressed This Visit       Unprioritized   Anxiety - Primary    Start lexapro 10 mg and refill lorazapam for prn F/u 1 month or sooner prn  Pt wants to hold of on counselor at this time       Relevant Medications   LORazepam (ATIVAN) 0.5 MG tablet   escitalopram (LEXAPRO) 10 MG tablet     Meds ordered this encounter  Medications   LORazepam (ATIVAN) 0.5 MG tablet    Sig: Take 1 tablet (0.5 mg total) by mouth every 8 (eight) hours as needed for anxiety.    Dispense:  30 tablet    Refill:  1   escitalopram (LEXAPRO) 10 MG tablet    Sig: Take 1 tablet (10 mg total) by mouth daily.  Dispense:  30 tablet    Refill:  1    I, Jose Held, DO, personally preformed the services described in this documentation.  All medical record entries made by the scribe were at my direction and in my presence.  I have reviewed the chart and discharge instructions (if applicable) and agree that the record reflects my personal performance and is accurate and complete. 02/01/2022   I,Jose [name],acting as a scribe for Jose Held, DO.,have documented all relevant documentation on the behalf of Jose Held, DO,as directed by  Jose Held, DO while in the presence of Jose Held, DO.   Jose Held, DO

## 2022-02-15 DIAGNOSIS — F419 Anxiety disorder, unspecified: Secondary | ICD-10-CM | POA: Diagnosis not present

## 2022-02-15 DIAGNOSIS — M4326 Fusion of spine, lumbar region: Secondary | ICD-10-CM | POA: Diagnosis not present

## 2022-02-15 DIAGNOSIS — E7800 Pure hypercholesterolemia, unspecified: Secondary | ICD-10-CM | POA: Diagnosis not present

## 2022-02-15 DIAGNOSIS — M4316 Spondylolisthesis, lumbar region: Secondary | ICD-10-CM | POA: Diagnosis not present

## 2022-02-15 DIAGNOSIS — K760 Fatty (change of) liver, not elsewhere classified: Secondary | ICD-10-CM | POA: Diagnosis not present

## 2022-02-15 DIAGNOSIS — M5136 Other intervertebral disc degeneration, lumbar region: Secondary | ICD-10-CM | POA: Diagnosis not present

## 2022-02-15 DIAGNOSIS — J9811 Atelectasis: Secondary | ICD-10-CM | POA: Diagnosis not present

## 2022-02-15 DIAGNOSIS — E119 Type 2 diabetes mellitus without complications: Secondary | ICD-10-CM | POA: Diagnosis not present

## 2022-02-15 DIAGNOSIS — M5137 Other intervertebral disc degeneration, lumbosacral region: Secondary | ICD-10-CM | POA: Diagnosis not present

## 2022-02-15 DIAGNOSIS — Z79899 Other long term (current) drug therapy: Secondary | ICD-10-CM | POA: Diagnosis not present

## 2022-02-15 DIAGNOSIS — M4317 Spondylolisthesis, lumbosacral region: Secondary | ICD-10-CM | POA: Diagnosis not present

## 2022-02-15 DIAGNOSIS — M5416 Radiculopathy, lumbar region: Secondary | ICD-10-CM | POA: Diagnosis not present

## 2022-02-15 DIAGNOSIS — Z7984 Long term (current) use of oral hypoglycemic drugs: Secondary | ICD-10-CM | POA: Diagnosis not present

## 2022-02-15 DIAGNOSIS — M47816 Spondylosis without myelopathy or radiculopathy, lumbar region: Secondary | ICD-10-CM | POA: Diagnosis not present

## 2022-02-15 DIAGNOSIS — M4726 Other spondylosis with radiculopathy, lumbar region: Secondary | ICD-10-CM | POA: Diagnosis not present

## 2022-02-15 DIAGNOSIS — M48061 Spinal stenosis, lumbar region without neurogenic claudication: Secondary | ICD-10-CM | POA: Diagnosis not present

## 2022-02-21 ENCOUNTER — Encounter: Payer: Self-pay | Admitting: *Deleted

## 2022-02-21 ENCOUNTER — Telehealth: Payer: Self-pay | Admitting: *Deleted

## 2022-02-21 NOTE — Patient Outreach (Signed)
Care Coordination [facility] Note Transition Care Management Follow-up Telephone Call Date of discharge and from where: Friday 02/18/22 [facility], spondylopathy, lumbar spinal stenosis/ fusion How have you been since you were released from the hospital? "Overall, things are going good.  Just got off the phone with the surgeon's office, they are working to get my physical therapy set up in [place], I am staying in touch with them and expect them to call me back with details.  I am moving about fine and taking my time, using the walker.  My wife is keeping a close eye on me and is helping me with anything I need, but I am able to do just about everything on my own.  I have one spot on the incision that feels a bit more painful than the rest of the incision-- I told the surgeon about this before I left the hospital, so they know about this.  The incision itself looks fine, no redness, no drainage, no problems that we can see here at home" Any questions or concerns? No  Items Reviewed: Did the pt receive and understand the discharge instructions provided? Yes  Medications obtained and verified? Yes  Other? No  Any new allergies since your discharge? No  Dietary orders reviewed? Yes Do you have support at home? Yes  spouse assisting with care needs as indicated- patient reports he is essentially independent in self-care  Home Care and Equipment/Supplies: Were home health services ordered? "I am not sure if they want me going to outpatient PT or if they will have it come here at the house- I have called the surgeon this morning and am waiting for a call back with some answers" If so, what is the name of the agency? N/A  Has the agency set up a time to come to the patient's home? not applicable Were any new equipment or medical supplies ordered?  Yes: walker What is the name of the medical supply agency? "I don't know-- they gave it to me before I left the hospital" Were you able to  get the supplies/equipment? yes Do you have any questions related to the use of the equipment or supplies? No  Functional Questionnaire: (I = Independent and D = Dependent) ADLs: I  spouse assisting with care needs as indicated  Bathing/Dressing- I  Meal Prep- I  Eating- I  Maintaining continence- I  Transferring/Ambulation- I  Managing Meds- I  Follow up appointments reviewed:  PCP Hospital f/u appt confirmed? Yes  Scheduled to see PCP on Thursday 03/03/22- video visit @ 11:00 am [facility] f/u appt confirmed? Yes  Scheduled to see neurosurgeon at [facility] on Tuesday, 03/08/22 @ 11:00 am Are transportation arrangements needed? No  If their condition worsens, is the pt aware to call PCP or go to the Emergency Dept.? Yes Was the patient provided with contact information for the PCP's office or ED? No- declined; reports has all provider phone numbers/ verified Was to pt encouraged to call back with questions or concerns? Yes  SDOH assessments and interventions completed:   Yes SDOH Interventions Today    Flowsheet Row Most Recent Value  SDOH Interventions   Food Insecurity Interventions Intervention Not Indicated  Transportation Interventions Intervention Not Indicated  [spouse providing transportation post-recent surgery]      Care Coordination Interventions:  Provided education around signs/ symptoms post-op incisional infection along with corresponding action plan should signs/ symptoms develop    Encounter Outcome:  Pt. Visit Completed  Oneta Rack, RN, BSN, CCRN Alumnus RN CM Care Coordination/ Transition of [place] Management [phone]: direct office

## 2022-03-03 ENCOUNTER — Telehealth (INDEPENDENT_AMBULATORY_CARE_PROVIDER_SITE_OTHER): Payer: BC Managed Care – PPO | Admitting: Family Medicine

## 2022-03-03 ENCOUNTER — Encounter: Payer: Self-pay | Admitting: Family Medicine

## 2022-03-03 DIAGNOSIS — F419 Anxiety disorder, unspecified: Secondary | ICD-10-CM | POA: Diagnosis not present

## 2022-03-03 MED ORDER — FLUOXETINE HCL 20 MG PO CAPS: 20.0000 mg | ORAL_CAPSULE | Freq: Every day | ORAL | 3 refills | Status: DC

## 2022-03-03 NOTE — Assessment & Plan Note (Signed)
D/c lexapro--- due to ED Start prozac and r/u 1 month

## 2022-03-03 NOTE — Progress Notes (Signed)
MyChart Video Visit    Virtual Visit via Video Note   This visit type was conducted due to national recommendations for restrictions regarding the COVID-19 Pandemic (e.g. social distancing) in an effort to limit this patient's exposure and mitigate transmission in our community. This patient is at least at moderate risk for complications without adequate follow up. This format is felt to be most appropriate for this patient at this time. Physical exam was limited by quality of the video and audio technology used for the visit. Nira Conn was able to get the patient set up on a video visit.  Patient location: Home Patient and provider in visit Provider location: Office  I discussed the limitations of evaluation and management by telemedicine and the availability of in person appointments. The patient expressed understanding and agreed to proceed.  Visit Date: 03/03/2022  Today's healthcare provider: [name], DO     Subjective:    Patient ID: Jose [name], male    DOB: 03-Jan-1971, 51 y.o.   MRN: [id number]  Chief Complaint  Patient presents with   Anxiety   Follow-up    HPI Patient is in today for a follow up visit.   He is recovering well following his back procedure.  He continues taking 0.5 mg lorazepam PRN, 10 mg lexapro daily PO and reports his anxiety has improved while taking it. He notes that he was taken off Lorazepam prior to his procedure and has not taken it since. He was given Xanax while in the hospital. He has difficulty reaching an orgasm since he started Lexapro and ativan. He has no difficulty maintaining an erection.  He reports his last cholesterol readings were stable while taking 40 mg Crestor daily PO.  Lab Results  Component Value Date   CHOL 219 (H) 08/30/2021   HDL 43.80 08/30/2021   LDLCALC 152 (H) 05/27/2021   LDLDIRECT 167.0 08/30/2021   TRIG 216.0 (H) 08/30/2021   CHOLHDL 5 08/30/2021   He reports his blood sugar ranges around  170 when he avoids carbohydrates but goes up to 370 after eating carbohydrates.  Lab Results  Component Value Date   HGBA1C 6.2 08/30/2021    Past Medical History:  Diagnosis Date   Arthritis    R hip   Constipation    Diabetes (HCC)    Fatigue    Herpes    Insomnia    Prediabetes    SOB (shortness of breath) on exertion    Swallowing difficulty     No past surgical history on file.  Family History  Problem Relation Age of Onset   Diabetes Mother    Diabetes Mellitus II Mother    Dementia Father    Diabetes Father    Hypertension Father    Hyperlipidemia Father    Melanoma Father    Skin cancer Paternal Grandmother    Stomach cancer Paternal Grandmother    Melanoma Paternal Grandmother    Heart attack Paternal Uncle    Cancer - Other Paternal Uncle        agent orange    Colon cancer Neg Hx    Esophageal cancer Neg Hx    Rectal cancer Neg Hx     Social History   Socioeconomic History   Marital status: Married    Spouse name: Damin Salido   Number of children: 3   Years of education: Not on file   Highest education level: Not on file  Occupational History   Occupation: Patent attorney  Comment: [employer]  Tobacco Use   Smoking status: Never   Smokeless tobacco: Never  Vaping Use   Vaping Use: Never used  Substance and Sexual Activity   Alcohol use: Yes    Comment: ocassionally   Drug use: No   Sexual activity: Yes    Partners: Female  Other Topics Concern   Not on file  Social History Narrative   Exercise some --- basketball 4-5 x a week for 40 min   Social Determinants of Health   Financial Resource Strain: Not on file  Food Insecurity: No Food Insecurity (02/21/2022)   Hunger Vital Sign    Worried About Running Out of Food in the Last Year: Never true    Ran Out of Food in the Last Year: Never true  Transportation Needs: No Transportation Needs (02/21/2022)   PRAPARE - Hydrologist (Medical): No     Lack of Transportation (Non-Medical): No  Physical Activity: Not on file  Stress: Not on file  Social Connections: Not on file  Intimate Partner Violence: Not on file    Outpatient Medications Prior to Visit  Medication Sig Dispense Refill   b complex vitamins capsule Take 1 capsule by mouth daily as needed.     BABY ASPIRIN PO Take 1 tablet by mouth as needed.     Cholecalciferol (VITAMIN D3 PO) Take 1 tablet by mouth daily as needed.     Coenzyme Q10 (CO Q 10 PO) Take 1 tablet by mouth as needed.     LORazepam (ATIVAN) 0.5 MG tablet Take 1 tablet (0.5 mg total) by mouth every 8 (eight) hours as needed for anxiety. 30 tablet 1   metFORMIN (GLUCOPHAGE) 500 MG tablet Take 1 tablet (500 mg total) by mouth 2 (two) times daily with a meal. (Patient taking differently: Take 1,000 mg by mouth 2 (two) times daily with a meal.) 180 tablet 1   Omega-3 Fatty Acids (FISH OIL PO) Take 1 tablet by mouth daily as needed.     rosuvastatin (CRESTOR) 40 MG tablet Take 1 tablet (40 mg total) by mouth daily. 90 tablet 1   Turmeric (QC TUMERIC COMPLEX PO) Take 1 tablet by mouth daily as needed.     valACYclovir (VALTREX) 1000 MG tablet Take 1 tablet (1,000 mg total) by mouth daily. 90 tablet 1   escitalopram (LEXAPRO) 10 MG tablet Take 1 tablet (10 mg total) by mouth daily. 30 tablet 1   No facility-administered medications prior to visit.    No Known Allergies  Review of Systems  Constitutional:  Negative for fever and malaise/fatigue.  HENT:  Negative for congestion.   Eyes:  Negative for blurred vision.  Respiratory:  Negative for cough and shortness of breath.   Cardiovascular:  Negative for chest pain, palpitations and leg swelling.  Gastrointestinal:  Negative for vomiting.  Musculoskeletal:  Negative for back pain.  Skin:  Negative for rash.  Neurological:  Negative for loss of consciousness and headaches.       Objective:    Physical Exam Vitals and nursing note reviewed.   Constitutional:      Appearance: He is well-developed.  HENT:     Head: Normocephalic and atraumatic.  Eyes:     Pupils: Pupils are equal, round, and reactive to light.  Neck:     Thyroid: No thyromegaly.  Cardiovascular:     Rate and Rhythm: Normal rate and regular rhythm.     Heart sounds: No murmur heard.  Pulmonary:     Effort: Pulmonary effort is normal. No respiratory distress.     Breath sounds: Normal breath sounds. No wheezing or rales.  Chest:     Chest wall: No tenderness.  Musculoskeletal:     Cervical back: Normal range of motion and neck supple.     Right hip: Tenderness present. Normal range of motion. Normal strength.     Left hip: Tenderness present. Normal range of motion. Normal strength.     Right foot: Bony tenderness present. No swelling.     Left foot: Bony tenderness present. No swelling.  Skin:    General: Skin is warm and dry.  Neurological:     Mental Status: He is alert and oriented to person, place, and time.  Psychiatric:        Behavior: Behavior normal.        Thought Content: Thought content normal.        Judgment: Judgment normal.     There were no vitals taken for this visit. Wt Readings from Last 3 Encounters:  02/01/22 206 lb 3.2 oz (93.5 kg)  07/14/21 198 lb (89.8 kg)  06/23/21 199 lb (90.3 kg)       Assessment & Plan:  Anxiety Assessment & Plan: D/c lexapro--- due to ED Start prozac and r/u 1 month   Orders: -     FLUoxetine HCl; Take 1 capsule (20 mg total) by mouth daily.  Dispense: 30 capsule; Refill: 3     I discussed the assessment and treatment plan with the patient. The patient was provided an opportunity to ask questions and all were answered. The patient agreed with the plan and demonstrated an understanding of the instructions.   The patient was advised to call back or seek an in-person evaluation if the symptoms worsen or if the condition fails to improve as anticipated.  [name], DO [place] at [employer] [phone] (phone) [phone] (fax)   Samuel Bouche as a scribe for [name], DO.,have documented all relevant documentation on the behalf of [name], DO,as directed by  [name], DO while in the presence of [name], DO.   [place]

## 2022-03-08 DIAGNOSIS — M4326 Fusion of spine, lumbar region: Secondary | ICD-10-CM | POA: Diagnosis not present

## 2022-03-08 DIAGNOSIS — M79604 Pain in right leg: Secondary | ICD-10-CM | POA: Diagnosis not present

## 2022-03-24 DIAGNOSIS — E1165 Type 2 diabetes mellitus with hyperglycemia: Secondary | ICD-10-CM | POA: Diagnosis not present

## 2022-03-25 ENCOUNTER — Other Ambulatory Visit: Payer: Self-pay | Admitting: *Deleted

## 2022-03-25 ENCOUNTER — Other Ambulatory Visit: Payer: Self-pay | Admitting: Family Medicine

## 2022-03-25 DIAGNOSIS — M6281 Muscle weakness (generalized): Secondary | ICD-10-CM | POA: Diagnosis not present

## 2022-03-25 DIAGNOSIS — M5451 Vertebrogenic low back pain: Secondary | ICD-10-CM | POA: Diagnosis not present

## 2022-03-25 DIAGNOSIS — F419 Anxiety disorder, unspecified: Secondary | ICD-10-CM

## 2022-03-28 DIAGNOSIS — G8918 Other acute postprocedural pain: Secondary | ICD-10-CM | POA: Diagnosis not present

## 2022-03-28 DIAGNOSIS — Z981 Arthrodesis status: Secondary | ICD-10-CM | POA: Diagnosis not present

## 2022-03-28 DIAGNOSIS — M5134 Other intervertebral disc degeneration, thoracic region: Secondary | ICD-10-CM | POA: Diagnosis not present

## 2022-03-28 DIAGNOSIS — M25561 Pain in right knee: Secondary | ICD-10-CM | POA: Diagnosis not present

## 2022-03-28 DIAGNOSIS — M4807 Spinal stenosis, lumbosacral region: Secondary | ICD-10-CM | POA: Diagnosis not present

## 2022-03-28 DIAGNOSIS — M5030 Other cervical disc degeneration, unspecified cervical region: Secondary | ICD-10-CM | POA: Diagnosis not present

## 2022-03-28 DIAGNOSIS — M5136 Other intervertebral disc degeneration, lumbar region: Secondary | ICD-10-CM | POA: Diagnosis not present

## 2022-03-30 DIAGNOSIS — M6281 Muscle weakness (generalized): Secondary | ICD-10-CM | POA: Diagnosis not present

## 2022-03-30 DIAGNOSIS — M5451 Vertebrogenic low back pain: Secondary | ICD-10-CM | POA: Diagnosis not present

## 2022-04-01 DIAGNOSIS — M5451 Vertebrogenic low back pain: Secondary | ICD-10-CM | POA: Diagnosis not present

## 2022-04-01 DIAGNOSIS — M6281 Muscle weakness (generalized): Secondary | ICD-10-CM | POA: Diagnosis not present

## 2022-04-13 DIAGNOSIS — M5416 Radiculopathy, lumbar region: Secondary | ICD-10-CM | POA: Diagnosis not present

## 2022-04-13 DIAGNOSIS — Z981 Arthrodesis status: Secondary | ICD-10-CM | POA: Diagnosis not present

## 2022-04-14 ENCOUNTER — Other Ambulatory Visit: Payer: Self-pay | Admitting: Pain Medicine

## 2022-04-14 DIAGNOSIS — M5416 Radiculopathy, lumbar region: Secondary | ICD-10-CM

## 2022-04-20 ENCOUNTER — Ambulatory Visit
Admission: RE | Admit: 2022-04-20 | Discharge: 2022-04-20 | Disposition: A | Discharge status: Home / Self Care | Payer: BC Managed Care – PPO | Source: Ambulatory Visit | Attending: Pain Medicine | Admitting: Pain Medicine

## 2022-04-20 DIAGNOSIS — M5416 Radiculopathy, lumbar region: Secondary | ICD-10-CM

## 2022-04-27 DIAGNOSIS — M5416 Radiculopathy, lumbar region: Secondary | ICD-10-CM | POA: Diagnosis not present

## 2022-05-05 DIAGNOSIS — M5441 Lumbago with sciatica, right side: Secondary | ICD-10-CM | POA: Diagnosis not present

## 2022-05-05 DIAGNOSIS — M6281 Muscle weakness (generalized): Secondary | ICD-10-CM | POA: Diagnosis not present

## 2022-05-09 DIAGNOSIS — M5441 Lumbago with sciatica, right side: Secondary | ICD-10-CM | POA: Diagnosis not present

## 2022-05-09 DIAGNOSIS — M6281 Muscle weakness (generalized): Secondary | ICD-10-CM | POA: Diagnosis not present

## 2022-05-11 DIAGNOSIS — M6281 Muscle weakness (generalized): Secondary | ICD-10-CM | POA: Diagnosis not present

## 2022-05-11 DIAGNOSIS — M5441 Lumbago with sciatica, right side: Secondary | ICD-10-CM | POA: Diagnosis not present

## 2022-05-12 IMAGING — MR MR CERVICAL SPINE W/O CM
5 series · 41 of 48 positions shown · non-contrast
Comparison: None.

CLINICAL DATA: Neck pain

EXAM:
MRI CERVICAL SPINE WITHOUT CONTRAST
TECHNIQUE: Multiplanar, multisequence MR imaging of the cervical spine was
performed. No intravenous contrast was administered.

[Series 4: T2 · sagittal · 3.0mm · 0.69mm/px · 6 of 13 slices shown (1 of 2)]
[im 1/13]
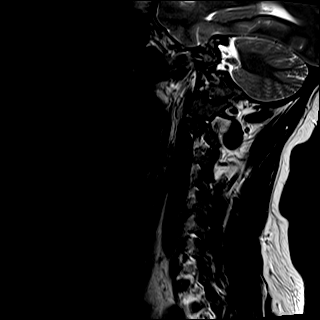
[im 3/13]
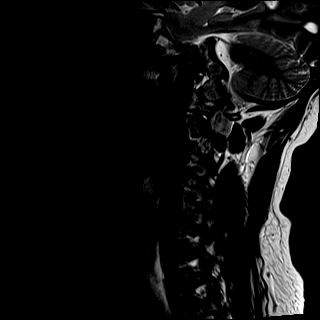
[im 5/13]
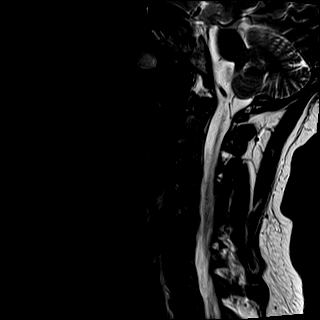
[im 8/13]
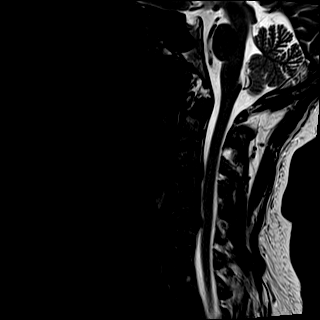
[im 10/13]
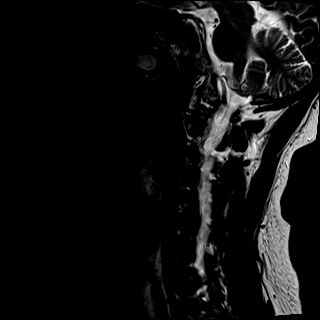
[im 13/13]
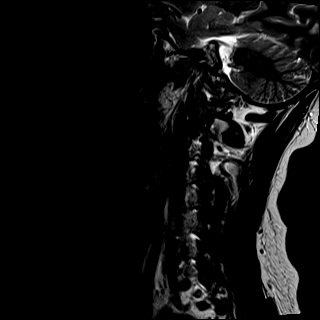

[Series 5: T1 · sagittal · 3.0mm · 0.86mm/px · 7 of 13 slices shown]
[im 1/13]
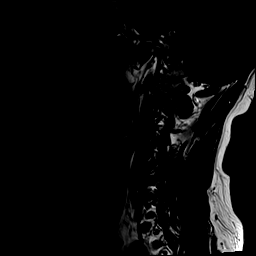
[im 3/13]
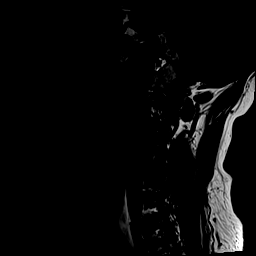
[im 5/13]
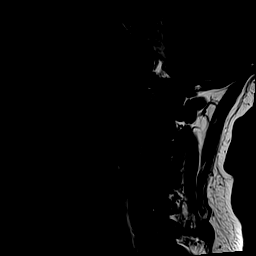
[im 7/13]
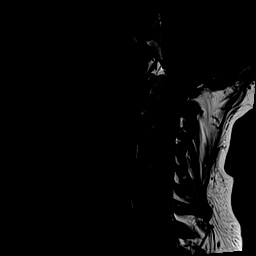
[im 9/13]
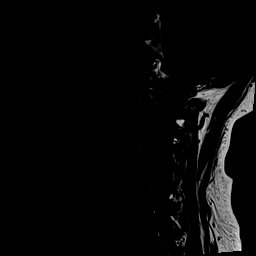
[im 11/13]
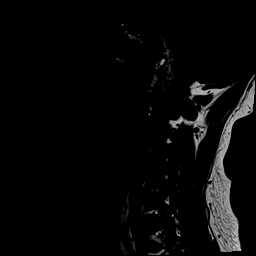
[im 13/13]
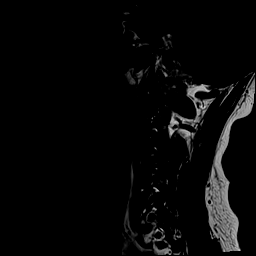

[Series 6: STIR · sagittal · 3.0mm · 0.69mm/px · 7 of 13 slices shown]
[im 1/13]
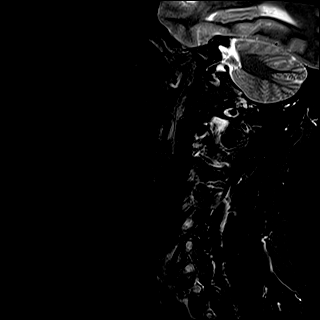
[im 3/13]
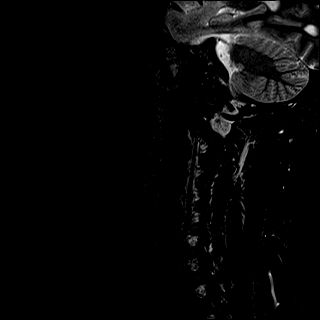
[im 5/13]
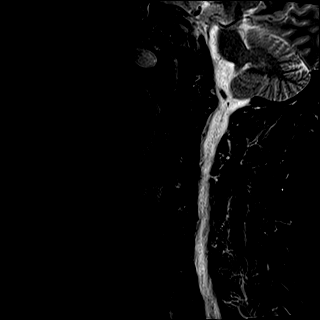
[im 7/13]
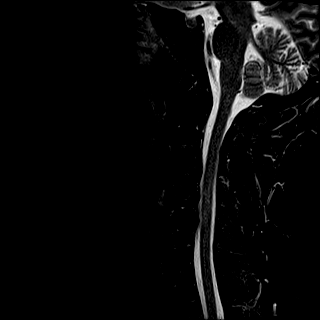
[im 9/13]
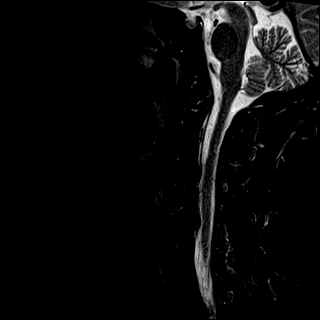
[im 11/13]
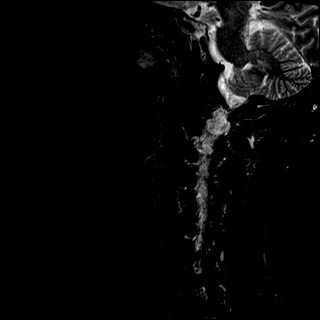
[im 13/13]
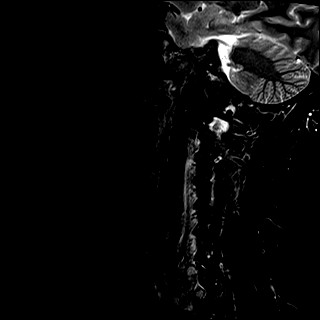

[Series 7: T2 · axial · 3.0mm · 0.62mm/px · z∈[-112,-15]mm · 13 of 27 slices shown (2 of 2)]
[im 1/27]
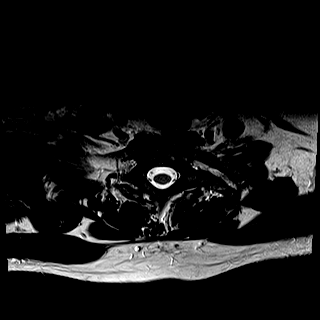
[im 3/27]
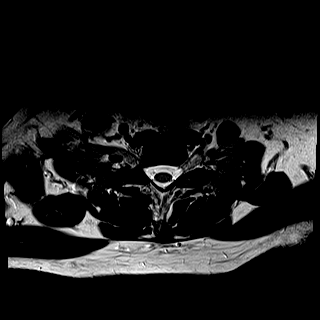
[im 5/27]
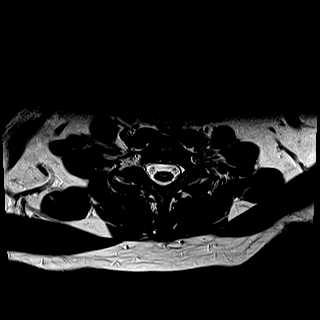
[im 7/27]
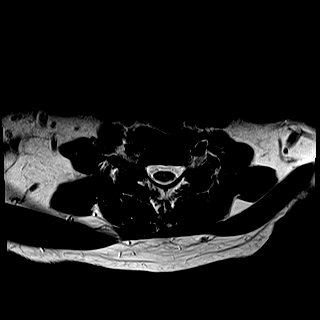
[im 9/27]
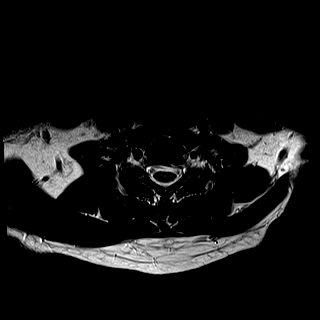
[im 11/27]
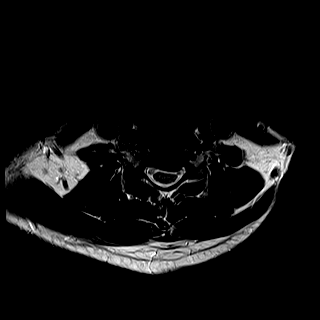
[im 13/27]
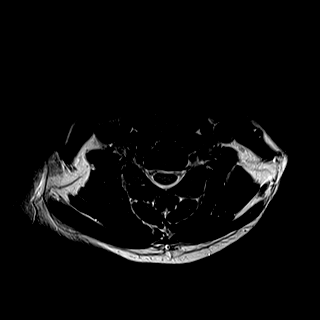
[im 15/27]
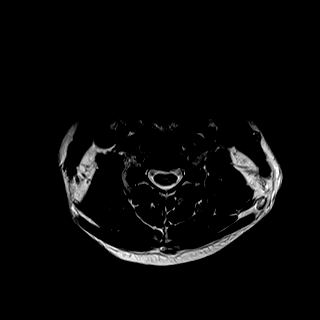
[im 17/27]
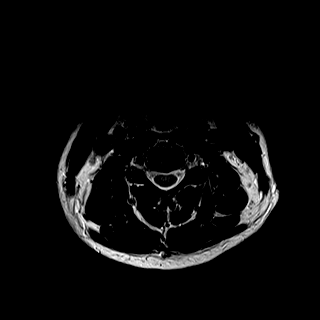
[im 19/27]
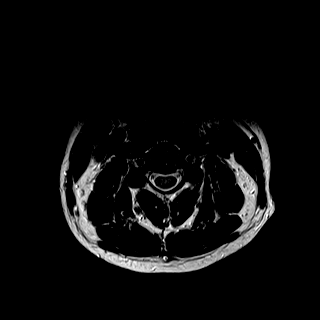
[im 21/27]
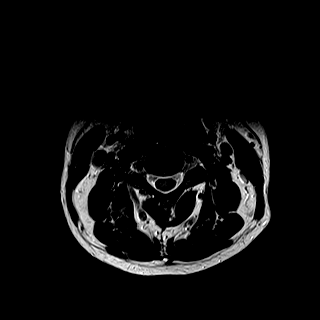
[im 23/27]
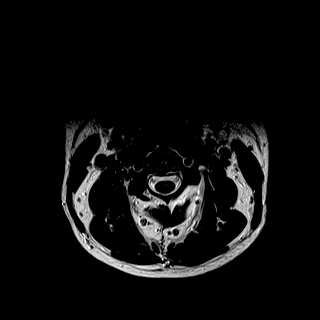
[im 27/27]
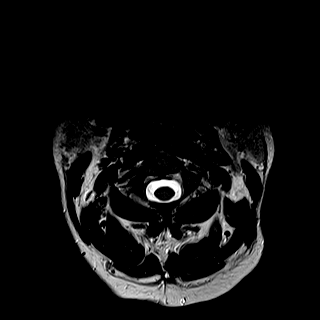

[Series 8: mpgr ax · axial · 3.0mm · 0.35mm/px · z∈[-101,-5]mm · 8 of 27 slices shown]
[im 1/27]
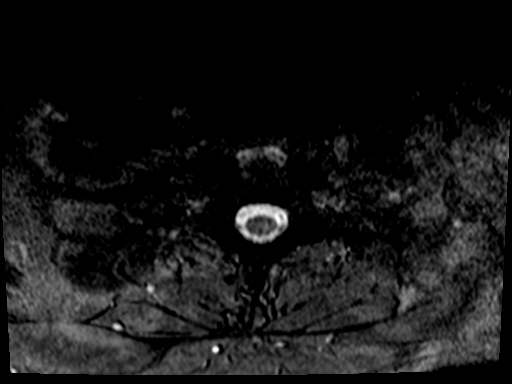
[im 5/27]
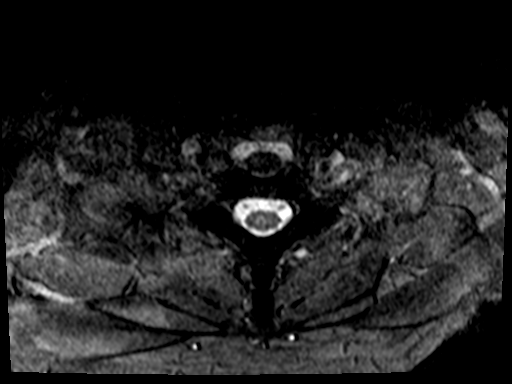
[im 9/27]
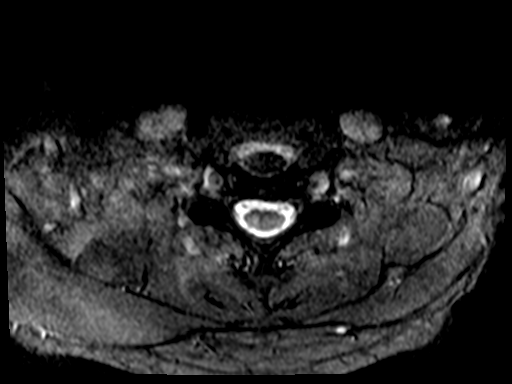
[im 13/27]
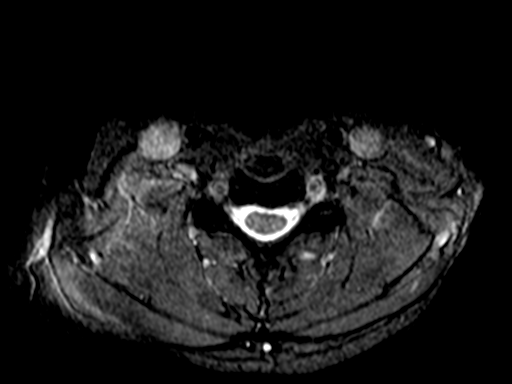
[im 15/27]
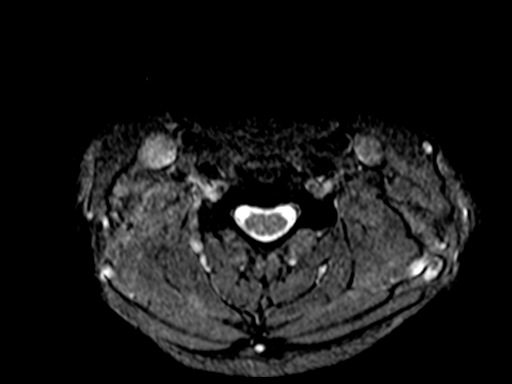
[im 19/27]
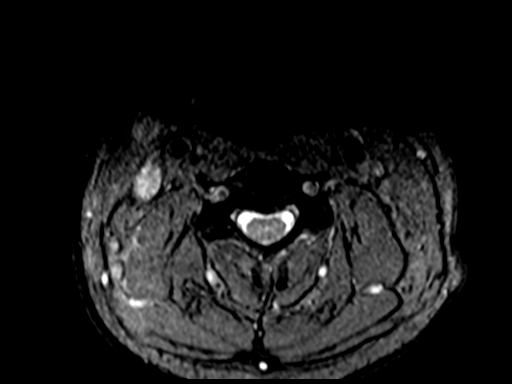
[im 23/27]
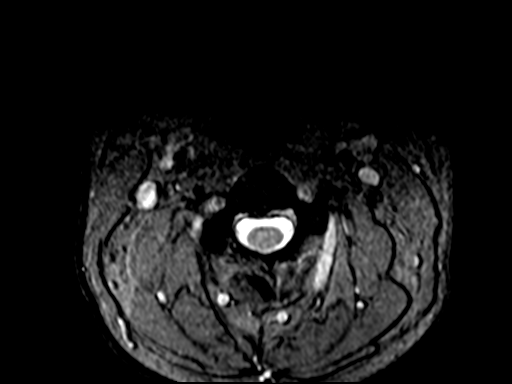
[im 27/27]
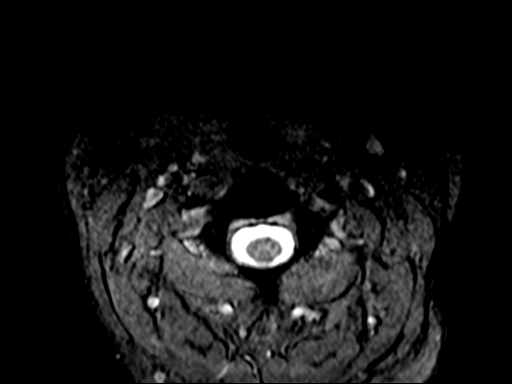

[41 of 48 positions shown; findings below may reference images not displayed]

FINDINGS: Alignment: Straightening of cervical lordosis with slight reversal.

Vertebrae: Normal bone marrow signal intensity. No focal osseous
lesion. No fracture.

Cord: Normal signal and morphology.

Posterior Fossa, vertebral arteries: Negative.

Disc levels: Multilevel desiccation. Mild disc space loss and
osteophytosis at the C5-6 level.

C2-3: No significant disc bulge, spinal canal or neural foraminal
narrowing.

C3-4: Central protrusion with uncovertebral and bilateral facet
degenerative spurring. Mild spinal canal and bilateral neural
foraminal narrowing.

C4-5: Central protrusion/annular fissuring with left uncovertebral
and facet degenerative spurring. Mild spinal canal narrowing. No
significant neural foraminal narrowing.

C5-6: Disc osteophyte complex with superimposed left
paracentral/subarticular protrusion, uncovertebral and bilateral
facet hypertrophy. Mild spinal canal, moderate right and mild left
neural foraminal narrowing.

C6-7: No significant disc bulge, spinal canal or neural foraminal
narrowing.

C7-T1: No significant disc bulge, spinal canal or neural foraminal
narrowing.

Paraspinal tissues: Within normal limits.
IMPRESSION: Mild spinal canal narrowing at the C3-6 levels. Moderate right C5-6
neural foraminal narrowing.

Otherwise mild C3-4 and C5-6 neural foraminal narrowing.

Central C4-5 protrusion with annular fissuring.

## 2022-05-13 DIAGNOSIS — M5441 Lumbago with sciatica, right side: Secondary | ICD-10-CM | POA: Diagnosis not present

## 2022-05-13 DIAGNOSIS — M6281 Muscle weakness (generalized): Secondary | ICD-10-CM | POA: Diagnosis not present

## 2022-05-16 DIAGNOSIS — M5441 Lumbago with sciatica, right side: Secondary | ICD-10-CM | POA: Diagnosis not present

## 2022-05-16 DIAGNOSIS — M6281 Muscle weakness (generalized): Secondary | ICD-10-CM | POA: Diagnosis not present

## 2022-05-17 DIAGNOSIS — M79604 Pain in right leg: Secondary | ICD-10-CM | POA: Diagnosis not present

## 2022-05-17 DIAGNOSIS — M4326 Fusion of spine, lumbar region: Secondary | ICD-10-CM | POA: Diagnosis not present

## 2022-05-18 DIAGNOSIS — M5416 Radiculopathy, lumbar region: Secondary | ICD-10-CM | POA: Diagnosis not present

## 2022-05-18 DIAGNOSIS — M6281 Muscle weakness (generalized): Secondary | ICD-10-CM | POA: Diagnosis not present

## 2022-05-18 DIAGNOSIS — M5441 Lumbago with sciatica, right side: Secondary | ICD-10-CM | POA: Diagnosis not present

## 2022-05-18 DIAGNOSIS — Z981 Arthrodesis status: Secondary | ICD-10-CM | POA: Diagnosis not present

## 2022-05-20 DIAGNOSIS — M6281 Muscle weakness (generalized): Secondary | ICD-10-CM | POA: Diagnosis not present

## 2022-05-20 DIAGNOSIS — M5441 Lumbago with sciatica, right side: Secondary | ICD-10-CM | POA: Diagnosis not present

## 2022-05-24 DIAGNOSIS — M5116 Intervertebral disc disorders with radiculopathy, lumbar region: Secondary | ICD-10-CM | POA: Diagnosis not present

## 2022-05-24 DIAGNOSIS — M79604 Pain in right leg: Secondary | ICD-10-CM | POA: Diagnosis not present

## 2022-05-24 DIAGNOSIS — M48061 Spinal stenosis, lumbar region without neurogenic claudication: Secondary | ICD-10-CM | POA: Diagnosis not present

## 2022-05-25 DIAGNOSIS — M6281 Muscle weakness (generalized): Secondary | ICD-10-CM | POA: Diagnosis not present

## 2022-05-25 DIAGNOSIS — M5441 Lumbago with sciatica, right side: Secondary | ICD-10-CM | POA: Diagnosis not present

## 2022-05-26 DIAGNOSIS — M5417 Radiculopathy, lumbosacral region: Secondary | ICD-10-CM | POA: Diagnosis not present

## 2022-05-27 DIAGNOSIS — M5441 Lumbago with sciatica, right side: Secondary | ICD-10-CM | POA: Diagnosis not present

## 2022-05-27 DIAGNOSIS — M6281 Muscle weakness (generalized): Secondary | ICD-10-CM | POA: Diagnosis not present

## 2022-05-31 DIAGNOSIS — M6281 Muscle weakness (generalized): Secondary | ICD-10-CM | POA: Diagnosis not present

## 2022-05-31 DIAGNOSIS — F4321 Adjustment disorder with depressed mood: Secondary | ICD-10-CM | POA: Diagnosis not present

## 2022-05-31 DIAGNOSIS — M5441 Lumbago with sciatica, right side: Secondary | ICD-10-CM | POA: Diagnosis not present

## 2022-06-01 DIAGNOSIS — D2262 Melanocytic nevi of left upper limb, including shoulder: Secondary | ICD-10-CM | POA: Diagnosis not present

## 2022-06-01 DIAGNOSIS — L821 Other seborrheic keratosis: Secondary | ICD-10-CM | POA: Diagnosis not present

## 2022-06-01 DIAGNOSIS — D2261 Melanocytic nevi of right upper limb, including shoulder: Secondary | ICD-10-CM | POA: Diagnosis not present

## 2022-06-01 DIAGNOSIS — D225 Melanocytic nevi of trunk: Secondary | ICD-10-CM | POA: Diagnosis not present

## 2022-06-02 DIAGNOSIS — M5441 Lumbago with sciatica, right side: Secondary | ICD-10-CM | POA: Diagnosis not present

## 2022-06-02 DIAGNOSIS — M6281 Muscle weakness (generalized): Secondary | ICD-10-CM | POA: Diagnosis not present

## 2022-06-07 DIAGNOSIS — M6281 Muscle weakness (generalized): Secondary | ICD-10-CM | POA: Diagnosis not present

## 2022-06-07 DIAGNOSIS — M5441 Lumbago with sciatica, right side: Secondary | ICD-10-CM | POA: Diagnosis not present

## 2022-06-15 DIAGNOSIS — M6281 Muscle weakness (generalized): Secondary | ICD-10-CM | POA: Diagnosis not present

## 2022-06-15 DIAGNOSIS — M5441 Lumbago with sciatica, right side: Secondary | ICD-10-CM | POA: Diagnosis not present

## 2022-06-17 DIAGNOSIS — M5441 Lumbago with sciatica, right side: Secondary | ICD-10-CM | POA: Diagnosis not present

## 2022-06-17 DIAGNOSIS — M6281 Muscle weakness (generalized): Secondary | ICD-10-CM | POA: Diagnosis not present

## 2022-06-20 DIAGNOSIS — M6281 Muscle weakness (generalized): Secondary | ICD-10-CM | POA: Diagnosis not present

## 2022-06-20 DIAGNOSIS — M5441 Lumbago with sciatica, right side: Secondary | ICD-10-CM | POA: Diagnosis not present

## 2022-06-22 DIAGNOSIS — M5441 Lumbago with sciatica, right side: Secondary | ICD-10-CM | POA: Diagnosis not present

## 2022-06-22 DIAGNOSIS — M6281 Muscle weakness (generalized): Secondary | ICD-10-CM | POA: Diagnosis not present

## 2022-06-29 DIAGNOSIS — M6281 Muscle weakness (generalized): Secondary | ICD-10-CM | POA: Diagnosis not present

## 2022-06-29 DIAGNOSIS — M5441 Lumbago with sciatica, right side: Secondary | ICD-10-CM | POA: Diagnosis not present

## 2022-06-30 DIAGNOSIS — E119 Type 2 diabetes mellitus without complications: Secondary | ICD-10-CM | POA: Diagnosis not present

## 2022-07-01 ENCOUNTER — Encounter: Payer: Self-pay | Admitting: Family Medicine

## 2022-07-01 ENCOUNTER — Other Ambulatory Visit: Payer: Self-pay | Admitting: Family Medicine

## 2022-07-01 MED ORDER — GABAPENTIN 300 MG PO CAPS: 300.0000 mg | ORAL_CAPSULE | Freq: Three times a day (TID) | ORAL | 3 refills | Status: DC

## 2022-07-06 DIAGNOSIS — M5441 Lumbago with sciatica, right side: Secondary | ICD-10-CM | POA: Diagnosis not present

## 2022-07-06 DIAGNOSIS — M6281 Muscle weakness (generalized): Secondary | ICD-10-CM | POA: Diagnosis not present

## 2022-07-13 DIAGNOSIS — M5441 Lumbago with sciatica, right side: Secondary | ICD-10-CM | POA: Diagnosis not present

## 2022-07-13 DIAGNOSIS — M6281 Muscle weakness (generalized): Secondary | ICD-10-CM | POA: Diagnosis not present

## 2022-07-20 DIAGNOSIS — M5441 Lumbago with sciatica, right side: Secondary | ICD-10-CM | POA: Diagnosis not present

## 2022-07-20 DIAGNOSIS — M6281 Muscle weakness (generalized): Secondary | ICD-10-CM | POA: Diagnosis not present

## 2022-07-27 DIAGNOSIS — M5441 Lumbago with sciatica, right side: Secondary | ICD-10-CM | POA: Diagnosis not present

## 2022-07-27 DIAGNOSIS — M6281 Muscle weakness (generalized): Secondary | ICD-10-CM | POA: Diagnosis not present

## 2022-07-31 ENCOUNTER — Other Ambulatory Visit: Payer: Self-pay | Admitting: Family Medicine

## 2022-08-03 DIAGNOSIS — M5441 Lumbago with sciatica, right side: Secondary | ICD-10-CM | POA: Diagnosis not present

## 2022-08-03 DIAGNOSIS — M6281 Muscle weakness (generalized): Secondary | ICD-10-CM | POA: Diagnosis not present

## 2022-08-16 DIAGNOSIS — Z981 Arthrodesis status: Secondary | ICD-10-CM | POA: Diagnosis not present

## 2022-08-16 DIAGNOSIS — E119 Type 2 diabetes mellitus without complications: Secondary | ICD-10-CM | POA: Diagnosis not present

## 2022-08-23 ENCOUNTER — Other Ambulatory Visit: Payer: Self-pay | Admitting: Family Medicine

## 2022-08-23 DIAGNOSIS — A6001 Herpesviral infection of penis: Secondary | ICD-10-CM

## 2022-08-26 ENCOUNTER — Encounter: Payer: Self-pay | Admitting: *Deleted

## 2022-08-26 ENCOUNTER — Other Ambulatory Visit: Payer: Self-pay | Admitting: Family Medicine

## 2022-09-06 DIAGNOSIS — M5441 Lumbago with sciatica, right side: Secondary | ICD-10-CM | POA: Diagnosis not present

## 2022-09-06 DIAGNOSIS — M6281 Muscle weakness (generalized): Secondary | ICD-10-CM | POA: Diagnosis not present

## 2022-09-12 ENCOUNTER — Encounter: Payer: Self-pay | Admitting: *Deleted

## 2022-09-26 ENCOUNTER — Other Ambulatory Visit: Payer: Self-pay | Admitting: Family Medicine

## 2022-09-26 DIAGNOSIS — A6001 Herpesviral infection of penis: Secondary | ICD-10-CM

## 2022-09-30 ENCOUNTER — Other Ambulatory Visit: Payer: Self-pay | Admitting: Family Medicine

## 2022-09-30 DIAGNOSIS — A6001 Herpesviral infection of penis: Secondary | ICD-10-CM

## 2022-10-13 ENCOUNTER — Other Ambulatory Visit (HOSPITAL_BASED_OUTPATIENT_CLINIC_OR_DEPARTMENT_OTHER): Payer: Self-pay

## 2022-10-13 MED ORDER — MOUNJARO 10 MG/0.5ML ~~LOC~~ SOAJ
10.0000 mg | SUBCUTANEOUS | 1 refills | Status: DC
  Filled 2022-10-13 – 2022-10-28 (×3): qty 2, 28d supply, fill #0
  Filled 2022-11-17 – 2022-11-19 (×4): qty 2, 28d supply, fill #1
  Filled 2022-12-26: qty 2, 28d supply, fill #2

## 2022-10-18 ENCOUNTER — Other Ambulatory Visit (HOSPITAL_BASED_OUTPATIENT_CLINIC_OR_DEPARTMENT_OTHER): Payer: Self-pay

## 2022-10-25 ENCOUNTER — Other Ambulatory Visit (HOSPITAL_BASED_OUTPATIENT_CLINIC_OR_DEPARTMENT_OTHER): Payer: Self-pay

## 2022-10-28 ENCOUNTER — Other Ambulatory Visit (HOSPITAL_BASED_OUTPATIENT_CLINIC_OR_DEPARTMENT_OTHER): Payer: Self-pay

## 2022-11-03 DIAGNOSIS — E119 Type 2 diabetes mellitus without complications: Secondary | ICD-10-CM | POA: Diagnosis not present

## 2022-11-03 DIAGNOSIS — E7841 Elevated Lipoprotein(a): Secondary | ICD-10-CM | POA: Diagnosis not present

## 2022-11-17 ENCOUNTER — Other Ambulatory Visit (HOSPITAL_BASED_OUTPATIENT_CLINIC_OR_DEPARTMENT_OTHER): Payer: Self-pay

## 2022-11-18 ENCOUNTER — Other Ambulatory Visit (HOSPITAL_BASED_OUTPATIENT_CLINIC_OR_DEPARTMENT_OTHER): Payer: Self-pay

## 2022-11-19 ENCOUNTER — Other Ambulatory Visit (HOSPITAL_BASED_OUTPATIENT_CLINIC_OR_DEPARTMENT_OTHER): Payer: Self-pay

## 2022-11-19 DIAGNOSIS — J069 Acute upper respiratory infection, unspecified: Secondary | ICD-10-CM | POA: Diagnosis not present

## 2022-11-29 ENCOUNTER — Other Ambulatory Visit: Payer: Self-pay | Admitting: Family Medicine

## 2022-12-13 DIAGNOSIS — M545 Low back pain, unspecified: Secondary | ICD-10-CM | POA: Diagnosis not present

## 2022-12-22 DIAGNOSIS — M545 Low back pain, unspecified: Secondary | ICD-10-CM | POA: Diagnosis not present

## 2022-12-26 ENCOUNTER — Other Ambulatory Visit (HOSPITAL_BASED_OUTPATIENT_CLINIC_OR_DEPARTMENT_OTHER): Payer: Self-pay

## 2022-12-27 ENCOUNTER — Other Ambulatory Visit: Payer: Self-pay | Admitting: Family Medicine

## 2022-12-28 DIAGNOSIS — M545 Low back pain, unspecified: Secondary | ICD-10-CM | POA: Diagnosis not present

## 2022-12-31 ENCOUNTER — Other Ambulatory Visit: Payer: Self-pay | Admitting: Family Medicine

## 2023-01-02 ENCOUNTER — Other Ambulatory Visit: Payer: Self-pay | Admitting: Family Medicine

## 2023-01-18 ENCOUNTER — Other Ambulatory Visit (HOSPITAL_BASED_OUTPATIENT_CLINIC_OR_DEPARTMENT_OTHER): Payer: Self-pay

## 2023-01-18 MED ORDER — MOUNJARO 10 MG/0.5ML ~~LOC~~ SOAJ
10.0000 mg | SUBCUTANEOUS | 2 refills | Status: AC
  Filled 2023-01-18: qty 2, 28d supply, fill #0
  Filled 2023-02-20: qty 2, 28d supply, fill #1
  Filled 2023-03-18: qty 2, 28d supply, fill #2
  Filled 2023-04-28: qty 2, 28d supply, fill #3
  Filled 2023-05-21 – 2023-05-27 (×3): qty 2, 28d supply, fill #4
  Filled 2023-06-30: qty 2, 28d supply, fill #5
  Filled 2023-08-12: qty 6, 84d supply, fill #6

## 2023-01-30 ENCOUNTER — Other Ambulatory Visit: Payer: Self-pay | Admitting: Family Medicine

## 2023-02-06 ENCOUNTER — Other Ambulatory Visit: Payer: Self-pay | Admitting: Family Medicine

## 2023-02-20 ENCOUNTER — Other Ambulatory Visit (HOSPITAL_BASED_OUTPATIENT_CLINIC_OR_DEPARTMENT_OTHER): Payer: Self-pay

## 2023-02-20 DIAGNOSIS — E119 Type 2 diabetes mellitus without complications: Secondary | ICD-10-CM | POA: Diagnosis not present

## 2023-02-20 DIAGNOSIS — M21062 Valgus deformity, not elsewhere classified, left knee: Secondary | ICD-10-CM | POA: Diagnosis not present

## 2023-02-20 DIAGNOSIS — E7841 Elevated Lipoprotein(a): Secondary | ICD-10-CM | POA: Diagnosis not present

## 2023-02-20 DIAGNOSIS — M21061 Valgus deformity, not elsewhere classified, right knee: Secondary | ICD-10-CM | POA: Diagnosis not present

## 2023-02-20 DIAGNOSIS — Z981 Arthrodesis status: Secondary | ICD-10-CM | POA: Diagnosis not present

## 2023-03-05 ENCOUNTER — Other Ambulatory Visit: Payer: Self-pay | Admitting: Family Medicine

## 2023-03-06 DIAGNOSIS — E7841 Elevated Lipoprotein(a): Secondary | ICD-10-CM | POA: Diagnosis not present

## 2023-03-06 DIAGNOSIS — E119 Type 2 diabetes mellitus without complications: Secondary | ICD-10-CM | POA: Diagnosis not present

## 2023-03-06 DIAGNOSIS — Z23 Encounter for immunization: Secondary | ICD-10-CM | POA: Diagnosis not present

## 2023-03-06 DIAGNOSIS — R6882 Decreased libido: Secondary | ICD-10-CM | POA: Diagnosis not present

## 2023-03-08 DIAGNOSIS — E119 Type 2 diabetes mellitus without complications: Secondary | ICD-10-CM | POA: Diagnosis not present

## 2023-03-08 DIAGNOSIS — R6882 Decreased libido: Secondary | ICD-10-CM | POA: Diagnosis not present

## 2023-03-18 ENCOUNTER — Other Ambulatory Visit (HOSPITAL_BASED_OUTPATIENT_CLINIC_OR_DEPARTMENT_OTHER): Payer: Self-pay

## 2023-04-10 ENCOUNTER — Other Ambulatory Visit: Payer: Self-pay | Admitting: Family Medicine

## 2023-04-11 DIAGNOSIS — M25561 Pain in right knee: Secondary | ICD-10-CM | POA: Diagnosis not present

## 2023-04-11 DIAGNOSIS — S83411A Sprain of medial collateral ligament of right knee, initial encounter: Secondary | ICD-10-CM | POA: Diagnosis not present

## 2023-04-11 DIAGNOSIS — S83511A Sprain of anterior cruciate ligament of right knee, initial encounter: Secondary | ICD-10-CM | POA: Diagnosis not present

## 2023-04-14 ENCOUNTER — Other Ambulatory Visit: Payer: Self-pay | Admitting: Family Medicine

## 2023-04-14 ENCOUNTER — Encounter: Payer: Self-pay | Admitting: *Deleted

## 2023-04-14 DIAGNOSIS — M25561 Pain in right knee: Secondary | ICD-10-CM | POA: Diagnosis not present

## 2023-04-18 DIAGNOSIS — S83271A Complex tear of lateral meniscus, current injury, right knee, initial encounter: Secondary | ICD-10-CM | POA: Diagnosis not present

## 2023-04-18 DIAGNOSIS — X58XXXA Exposure to other specified factors, initial encounter: Secondary | ICD-10-CM | POA: Diagnosis not present

## 2023-04-18 DIAGNOSIS — S83511A Sprain of anterior cruciate ligament of right knee, initial encounter: Secondary | ICD-10-CM | POA: Diagnosis not present

## 2023-04-18 DIAGNOSIS — Y9323 Activity, snow (alpine) (downhill) skiing, snow boarding, sledding, tobogganing and snow tubing: Secondary | ICD-10-CM | POA: Diagnosis not present

## 2023-04-28 ENCOUNTER — Other Ambulatory Visit (HOSPITAL_BASED_OUTPATIENT_CLINIC_OR_DEPARTMENT_OTHER): Payer: Self-pay

## 2023-04-28 DIAGNOSIS — X500XXA Overexertion from strenuous movement or load, initial encounter: Secondary | ICD-10-CM | POA: Diagnosis not present

## 2023-04-28 DIAGNOSIS — S83511A Sprain of anterior cruciate ligament of right knee, initial encounter: Secondary | ICD-10-CM | POA: Diagnosis not present

## 2023-04-28 DIAGNOSIS — M25561 Pain in right knee: Secondary | ICD-10-CM | POA: Diagnosis not present

## 2023-04-28 DIAGNOSIS — S83271A Complex tear of lateral meniscus, current injury, right knee, initial encounter: Secondary | ICD-10-CM | POA: Diagnosis not present

## 2023-04-28 DIAGNOSIS — G8918 Other acute postprocedural pain: Secondary | ICD-10-CM | POA: Diagnosis not present

## 2023-04-28 HISTORY — PX: OTHER SURGICAL HISTORY: SHX169

## 2023-05-02 DIAGNOSIS — Z9889 Other specified postprocedural states: Secondary | ICD-10-CM | POA: Diagnosis not present

## 2023-05-02 DIAGNOSIS — M25661 Stiffness of right knee, not elsewhere classified: Secondary | ICD-10-CM | POA: Diagnosis not present

## 2023-05-02 DIAGNOSIS — M6281 Muscle weakness (generalized): Secondary | ICD-10-CM | POA: Diagnosis not present

## 2023-05-04 DIAGNOSIS — Z9889 Other specified postprocedural states: Secondary | ICD-10-CM | POA: Diagnosis not present

## 2023-05-04 DIAGNOSIS — M6281 Muscle weakness (generalized): Secondary | ICD-10-CM | POA: Diagnosis not present

## 2023-05-04 DIAGNOSIS — M25661 Stiffness of right knee, not elsewhere classified: Secondary | ICD-10-CM | POA: Diagnosis not present

## 2023-05-07 ENCOUNTER — Other Ambulatory Visit: Payer: Self-pay | Admitting: Family Medicine

## 2023-05-09 DIAGNOSIS — Z9889 Other specified postprocedural states: Secondary | ICD-10-CM | POA: Diagnosis not present

## 2023-05-11 DIAGNOSIS — M25661 Stiffness of right knee, not elsewhere classified: Secondary | ICD-10-CM | POA: Diagnosis not present

## 2023-05-11 DIAGNOSIS — M6281 Muscle weakness (generalized): Secondary | ICD-10-CM | POA: Diagnosis not present

## 2023-05-11 DIAGNOSIS — Z9889 Other specified postprocedural states: Secondary | ICD-10-CM | POA: Diagnosis not present

## 2023-05-16 DIAGNOSIS — M6281 Muscle weakness (generalized): Secondary | ICD-10-CM | POA: Diagnosis not present

## 2023-05-16 DIAGNOSIS — Z9889 Other specified postprocedural states: Secondary | ICD-10-CM | POA: Diagnosis not present

## 2023-05-16 DIAGNOSIS — M25661 Stiffness of right knee, not elsewhere classified: Secondary | ICD-10-CM | POA: Diagnosis not present

## 2023-05-18 DIAGNOSIS — M6281 Muscle weakness (generalized): Secondary | ICD-10-CM | POA: Diagnosis not present

## 2023-05-18 DIAGNOSIS — M25661 Stiffness of right knee, not elsewhere classified: Secondary | ICD-10-CM | POA: Diagnosis not present

## 2023-05-18 DIAGNOSIS — Z9889 Other specified postprocedural states: Secondary | ICD-10-CM | POA: Diagnosis not present

## 2023-05-21 ENCOUNTER — Other Ambulatory Visit: Payer: Self-pay | Admitting: Family Medicine

## 2023-05-21 DIAGNOSIS — A6001 Herpesviral infection of penis: Secondary | ICD-10-CM

## 2023-05-22 ENCOUNTER — Other Ambulatory Visit (HOSPITAL_BASED_OUTPATIENT_CLINIC_OR_DEPARTMENT_OTHER): Payer: Self-pay

## 2023-05-22 DIAGNOSIS — M6281 Muscle weakness (generalized): Secondary | ICD-10-CM | POA: Diagnosis not present

## 2023-05-22 DIAGNOSIS — M25661 Stiffness of right knee, not elsewhere classified: Secondary | ICD-10-CM | POA: Diagnosis not present

## 2023-05-22 DIAGNOSIS — Z9889 Other specified postprocedural states: Secondary | ICD-10-CM | POA: Diagnosis not present

## 2023-05-24 DIAGNOSIS — M6281 Muscle weakness (generalized): Secondary | ICD-10-CM | POA: Diagnosis not present

## 2023-05-24 DIAGNOSIS — Z9889 Other specified postprocedural states: Secondary | ICD-10-CM | POA: Diagnosis not present

## 2023-05-24 DIAGNOSIS — M25661 Stiffness of right knee, not elsewhere classified: Secondary | ICD-10-CM | POA: Diagnosis not present

## 2023-05-27 ENCOUNTER — Other Ambulatory Visit (HOSPITAL_BASED_OUTPATIENT_CLINIC_OR_DEPARTMENT_OTHER): Payer: Self-pay

## 2023-05-29 ENCOUNTER — Other Ambulatory Visit (HOSPITAL_BASED_OUTPATIENT_CLINIC_OR_DEPARTMENT_OTHER): Payer: Self-pay

## 2023-05-30 DIAGNOSIS — M25661 Stiffness of right knee, not elsewhere classified: Secondary | ICD-10-CM | POA: Diagnosis not present

## 2023-05-30 DIAGNOSIS — M6281 Muscle weakness (generalized): Secondary | ICD-10-CM | POA: Diagnosis not present

## 2023-05-30 DIAGNOSIS — Z9889 Other specified postprocedural states: Secondary | ICD-10-CM | POA: Diagnosis not present

## 2023-06-01 DIAGNOSIS — Z9889 Other specified postprocedural states: Secondary | ICD-10-CM | POA: Diagnosis not present

## 2023-06-01 DIAGNOSIS — M25661 Stiffness of right knee, not elsewhere classified: Secondary | ICD-10-CM | POA: Diagnosis not present

## 2023-06-01 DIAGNOSIS — M6281 Muscle weakness (generalized): Secondary | ICD-10-CM | POA: Diagnosis not present

## 2023-06-05 DIAGNOSIS — M25661 Stiffness of right knee, not elsewhere classified: Secondary | ICD-10-CM | POA: Diagnosis not present

## 2023-06-05 DIAGNOSIS — Z9889 Other specified postprocedural states: Secondary | ICD-10-CM | POA: Diagnosis not present

## 2023-06-05 DIAGNOSIS — M6281 Muscle weakness (generalized): Secondary | ICD-10-CM | POA: Diagnosis not present

## 2023-06-09 ENCOUNTER — Other Ambulatory Visit: Payer: Self-pay | Admitting: Family Medicine

## 2023-06-13 DIAGNOSIS — Z9889 Other specified postprocedural states: Secondary | ICD-10-CM | POA: Diagnosis not present

## 2023-06-13 DIAGNOSIS — M6281 Muscle weakness (generalized): Secondary | ICD-10-CM | POA: Diagnosis not present

## 2023-06-13 DIAGNOSIS — M25661 Stiffness of right knee, not elsewhere classified: Secondary | ICD-10-CM | POA: Diagnosis not present

## 2023-06-20 DIAGNOSIS — Z9889 Other specified postprocedural states: Secondary | ICD-10-CM | POA: Diagnosis not present

## 2023-06-21 DIAGNOSIS — M25661 Stiffness of right knee, not elsewhere classified: Secondary | ICD-10-CM | POA: Diagnosis not present

## 2023-06-21 DIAGNOSIS — Z9889 Other specified postprocedural states: Secondary | ICD-10-CM | POA: Diagnosis not present

## 2023-06-21 DIAGNOSIS — M6281 Muscle weakness (generalized): Secondary | ICD-10-CM | POA: Diagnosis not present

## 2023-06-23 ENCOUNTER — Encounter: Payer: Self-pay | Admitting: Family Medicine

## 2023-06-23 ENCOUNTER — Ambulatory Visit: Admitting: Family Medicine

## 2023-06-23 VITALS — BP 98/70 | HR 73 | Temp 97.8°F | Resp 18 | Ht 69.0 in | Wt 170.2 lb

## 2023-06-23 DIAGNOSIS — Z125 Encounter for screening for malignant neoplasm of prostate: Secondary | ICD-10-CM

## 2023-06-23 DIAGNOSIS — Z23 Encounter for immunization: Secondary | ICD-10-CM | POA: Diagnosis not present

## 2023-06-23 DIAGNOSIS — E785 Hyperlipidemia, unspecified: Secondary | ICD-10-CM

## 2023-06-23 DIAGNOSIS — E1169 Type 2 diabetes mellitus with other specified complication: Secondary | ICD-10-CM | POA: Diagnosis not present

## 2023-06-23 DIAGNOSIS — E1165 Type 2 diabetes mellitus with hyperglycemia: Secondary | ICD-10-CM

## 2023-06-23 DIAGNOSIS — Z0000 Encounter for general adult medical examination without abnormal findings: Secondary | ICD-10-CM | POA: Diagnosis not present

## 2023-06-23 DIAGNOSIS — Z7984 Long term (current) use of oral hypoglycemic drugs: Secondary | ICD-10-CM

## 2023-06-23 LAB — CBC WITH DIFFERENTIAL/PLATELET
Basophils Absolute: 0 10*3/uL (ref 0.0–0.1)
Basophils Relative: 0.7 % (ref 0.0–3.0)
Eosinophils Absolute: 0.3 10*3/uL (ref 0.0–0.7)
Eosinophils Relative: 4.4 % (ref 0.0–5.0)
HCT: 43.5 % (ref 39.0–52.0)
Hemoglobin: 15.3 g/dL (ref 13.0–17.0)
Lymphocytes Relative: 25.7 % (ref 12.0–46.0)
Lymphs Abs: 1.8 10*3/uL (ref 0.7–4.0)
MCHC: 35.2 g/dL (ref 30.0–36.0)
MCV: 91.8 fl (ref 78.0–100.0)
Monocytes Absolute: 0.5 10*3/uL (ref 0.1–1.0)
Monocytes Relative: 7.9 % (ref 3.0–12.0)
Neutro Abs: 4.2 10*3/uL (ref 1.4–7.7)
Neutrophils Relative %: 61.3 % (ref 43.0–77.0)
Platelets: 302 10*3/uL (ref 150.0–400.0)
RBC: 4.74 Mil/uL (ref 4.22–5.81)
RDW: 14.6 % (ref 11.5–15.5)
WBC: 6.8 10*3/uL (ref 4.0–10.5)

## 2023-06-23 LAB — LIPID PANEL
Cholesterol: 134 mg/dL (ref 0–200)
HDL: 47.4 mg/dL (ref >39.00)
LDL Cholesterol: 68 mg/dL (ref 0–99)
NonHDL: 86.44
Total CHOL/HDL Ratio: 3
Triglycerides: 91 mg/dL (ref 0.0–149.0)
VLDL: 18.2 mg/dL (ref 0.0–40.0)

## 2023-06-23 LAB — COMPREHENSIVE METABOLIC PANEL WITH GFR
ALT: 34 U/L (ref 0–53)
AST: 24 U/L (ref 0–37)
Albumin: 5.1 g/dL (ref 3.5–5.2)
Alkaline Phosphatase: 54 U/L (ref 39–117)
BUN: 16 mg/dL (ref 6–23)
CO2: 28 meq/L (ref 19–32)
Calcium: 9.9 mg/dL (ref 8.4–10.5)
Chloride: 104 meq/L (ref 96–112)
Creatinine, Ser: 0.9 mg/dL (ref 0.40–1.50)
GFR: 98.93 mL/min (ref >60.00)
Glucose, Bld: 105 mg/dL — ABNORMAL HIGH (ref 70–99)
Potassium: 4.9 meq/L (ref 3.5–5.1)
Sodium: 141 meq/L (ref 135–145)
Total Bilirubin: 1.1 mg/dL (ref 0.2–1.2)
Total Protein: 7.1 g/dL (ref 6.0–8.3)

## 2023-06-23 LAB — HEMOGLOBIN A1C: Hgb A1c MFr Bld: 5 % (ref 4.6–6.5)

## 2023-06-23 LAB — MICROALBUMIN / CREATININE URINE RATIO
Creatinine,U: 151.1 mg/dL
Microalb Creat Ratio: 6.6 mg/g (ref 0.0–30.0)
Microalb, Ur: 1 mg/dL (ref 0.0–1.9)

## 2023-06-23 LAB — PSA: PSA: 1 ng/mL (ref 0.10–4.00)

## 2023-06-23 MED ORDER — METFORMIN HCL 500 MG PO TABS: 500.0000 mg | ORAL_TABLET | Freq: Two times a day (BID) | ORAL | 0 refills | Status: AC

## 2023-06-23 MED ORDER — ROSUVASTATIN CALCIUM 40 MG PO TABS: 40.0000 mg | ORAL_TABLET | Freq: Every day | ORAL | 1 refills | Status: DC

## 2023-06-23 NOTE — Patient Instructions (Signed)

## 2023-06-23 NOTE — Progress Notes (Signed)
 Established Patient Office Visit  Subjective   Patient ID: Jose [name], male    DOB: 01-04-1971  Age: 53 y.o. MRN: [id number]  Chief Complaint  Patient presents with   Annual Exam    Pt states fasting     HPI Discussed the use of AI scribe software for clinical note transcription with the patient, who gave verbal consent to proceed.  History of Present Illness Jose [name] "[name]" is a 53 year old male who presents for follow-up after ACL reconstruction surgery.  He is eight weeks post-operative from a bridge-enhanced ACL reconstruction surgery on his right knee. Recovery has been slower initially. He is attending physical therapy at [facility] in [place]. The surgery has significantly impacted his physical activity, leading to muscle mass loss due to prolonged inactivity. He uses crutches for additional support, especially for long distances, as his knee swells with overuse. He is taking Celebrex for pain management.  The injury occurred while skiing at [facility], requiring assistance from ski patrol to descend the mountain on a stretcher.  His past medical history includes a spinal fusion surgery approximately fourteen months ago, diabetes managed with metformin and Mounjaro, and hyperlipidemia managed with rosuvastatin. He has lost 45 pounds due to lifestyle changes.  No new moles or skin changes. His stomach is doing well. He has not checked his blood sugars recently but generally reports good control. He regularly visits a dentist and an eye doctor, although his vision is not optimal.   Patient Active Problem List   Diagnosis Date Noted   Anxiety 02/01/2022   Obesity (BMI 30-39.9) 05/19/2021   Dyslipidemia 12/05/2019   Preventative health care 11/29/2018   Need for hepatitis A and B vaccination 08/29/2014   Axillary swelling 02/24/2012   Hyperglycemia 08/05/2011   Fatigue 08/04/2011   Overweight 07/24/2009   ONYCHIA AND PARONYCHIA OF FINGER 07/24/2009   NUMBNESS,  HAND 11/10/2008   PLANTAR WART, RIGHT 10/07/2008   Brachial neuritis or radiculitis 10/07/2008   FOLLICULITIS 12/08/2006   Past Medical History:  Diagnosis Date   Arthritis    R hip   Constipation    Diabetes (HCC)    Fatigue    Herpes    Insomnia    Prediabetes    SOB (shortness of breath) on exertion    Swallowing difficulty    Past Surgical History:  Procedure Laterality Date   R knee bridge enhanced acl replacement Right 04/28/2023   [facility] --  Dr wittstein   Social History   Tobacco Use   Smoking status: Never   Smokeless tobacco: Never  Vaping Use   Vaping status: Never Used  Substance Use Topics   Alcohol use: Yes    Comment: ocassionally   Drug use: No   Social History   Socioeconomic History   Marital status: Married    Spouse name: Lige Lakeman   Number of children: 3   Years of education: Not on file   Highest education level: Not on file  Occupational History   Occupation: Designer, television/film set    Comment: [employer]  Tobacco Use   Smoking status: Never   Smokeless tobacco: Never  Vaping Use   Vaping status: Never Used  Substance and Sexual Activity   Alcohol use: Yes    Comment: ocassionally   Drug use: No   Sexual activity: Yes    Partners: Female  Other Topics Concern   Not on file  Social History Narrative   Exercise some --- basketball 4-5 x  a week for 40 min   Social Drivers of Corporate investment banker Strain: Low Risk  (02/20/2023)   Received from [facility]   Overall Financial Resource Strain (CARDIA)    Difficulty of Paying Living Expenses: Not hard at all  Food Insecurity: No Food Insecurity (02/20/2023)   Received from [facility]   Hunger Vital Sign    Worried About Running Out of Food in the Last Year: Never true    Ran Out of Food in the Last Year: Never true  Transportation Needs: No Transportation Needs (02/20/2023)   Received from [facility] -  Transportation    In the past 12 months, has lack of transportation kept you from medical appointments or from getting medications?: No    Lack of Transportation (Non-Medical): No  Physical Activity: Not on file  Stress: Not on file  Social Connections: Unknown (07/23/2021)   Received from [facility]   Social Network    Social Network: Not on file  Intimate Partner Violence: Unknown (06/21/2021)   Received from [facility]   HITS    Physically Hurt: Not on file    Insult or Talk Down To: Not on file    Threaten Physical Harm: Not on file    Scream or Curse: Not on file   Family Status  Relation Name Status   Mother  Alive   Father  Alive   Sister  Alive   Brother  Alive   MGM  Deceased   MGF  Deceased   PGM  Deceased   PGF  Deceased   Nutritional therapist  (Not Specified)   Neg Hx  (Not Specified)  No partnership data on file   Family History  Problem Relation Age of Onset   Diabetes Mother    Diabetes Mellitus II Mother    Dementia Father    Diabetes Father    Hypertension Father    Hyperlipidemia Father    Melanoma Father    Skin cancer Paternal Grandmother    Stomach cancer Paternal Grandmother    Melanoma Paternal Grandmother    Heart attack Paternal Uncle    Cancer - Other Paternal Uncle        agent orange    Colon cancer Neg Hx    Esophageal cancer Neg Hx    Rectal cancer Neg Hx    No Known Allergies    ROS    Objective:     BP 98/70 (BP Location: Right Arm, Patient Position: Sitting, Cuff Size: Normal)   Pulse 73   Temp 97.8 F (36.6 C) (Oral)   Resp 18   Ht 5\' 9"  (1.753 m)   Wt 170 lb 3.2 oz (77.2 kg)   SpO2 99%   BMI 25.13 kg/m  BP Readings from Last 3 Encounters:  06/23/23 98/70  02/01/22 118/80  07/14/21 119/80   Wt Readings from Last 3 Encounters:  06/23/23 170 lb 3.2 oz (77.2 kg)  02/01/22 206 lb 3.2 oz (93.5 kg)  07/14/21 198 lb (89.8 kg)   SpO2 Readings from Last 3 Encounters:  06/23/23 99%  02/01/22  96%  07/14/21 100%      Physical Exam   No results found for any visits on 06/23/23.  Last CBC Lab Results  Component Value Date   WBC 5.6 05/27/2021   HGB 15.6 05/27/2021   HCT 44.6 05/27/2021   MCV 93.0 05/27/2021   MCH 32.4  12/05/2019   RDW 13.4 05/27/2021   PLT 210.0 05/27/2021   Last metabolic panel Lab Results  Component Value Date   GLUCOSE 150 (H) 08/30/2021   NA 139 08/30/2021   K 4.3 08/30/2021   CL 102 08/30/2021   CO2 26 08/30/2021   BUN 13 08/30/2021   CREATININE 1.07 08/30/2021   GFR 81.41 08/30/2021   CALCIUM 9.5 08/30/2021   PROT 6.6 08/30/2021   ALBUMIN 4.6 08/30/2021   BILITOT 0.8 08/30/2021   ALKPHOS 55 08/30/2021   AST 55 (H) 08/30/2021   ALT 92 (H) 08/30/2021   Last lipids Lab Results  Component Value Date   CHOL 219 (H) 08/30/2021   HDL 43.80 08/30/2021   LDLCALC 152 (H) 05/27/2021   LDLDIRECT 167.0 08/30/2021   TRIG 216.0 (H) 08/30/2021   CHOLHDL 5 08/30/2021   Last hemoglobin A1c Lab Results  Component Value Date   HGBA1C 6.2 08/30/2021   Last thyroid functions Lab Results  Component Value Date   TSH 3.09 05/27/2021   Last vitamin D Lab Results  Component Value Date   VD25OH 31.6 06/09/2021   Last vitamin B12 and Folate Lab Results  Component Value Date   VITAMINB12 1,140 06/09/2021   FOLATE >20.0 06/09/2021      The ASCVD Risk score (Arnett DK, et al., 2019) failed to calculate for the following reasons:   The valid total cholesterol range is 130 to 320 mg/dL    Assessment & Plan:   Problem List Items Addressed This Visit       Unprioritized   Preventative health care - Primary   Ghm utd Check labs  See AVS  Health Maintenance  Topic Date Due   HIV Screening  Never done   Diabetic kidney evaluation - Urine ACR  Never done   Zoster Vaccines- Shingrix (1 of 2) Never done   Diabetic kidney evaluation - eGFR measurement  08/31/2022   COVID-19 Vaccine (3 - 2024-25 season) 11/20/2022   INFLUENZA VACCINE   10/20/2023   Colonoscopy  02/05/2027   DTaP/Tdap/Td (2 - Td or Tdap) 11/28/2028   Pneumococcal Vaccine 92-33 Years old  Completed   Hepatitis C Screening  Completed   HPV VACCINES  Aged Out         Relevant Orders   Lipid panel   CBC with Differential/Platelet   Comprehensive metabolic panel with GFR   Hemoglobin A1c   Microalbumin / creatinine urine ratio   PSA   Other Visit Diagnoses       Hyperlipidemia associated with type 2 diabetes mellitus (HCC)       Relevant Medications   rosuvastatin (CRESTOR) 40 MG tablet   metFORMIN (GLUCOPHAGE) 500 MG tablet   Other Relevant Orders   Lipid panel   CBC with Differential/Platelet     Type 2 diabetes mellitus with hyperglycemia, without long-term current use of insulin (HCC)       Relevant Medications   rosuvastatin (CRESTOR) 40 MG tablet   metFORMIN (GLUCOPHAGE) 500 MG tablet   Other Relevant Orders   CBC with Differential/Platelet   Comprehensive metabolic panel with GFR   Hemoglobin A1c   Microalbumin / creatinine urine ratio     Need for pneumococcal 20-valent conjugate vaccination       Relevant Orders   Pneumococcal conjugate vaccine 20-valent (Prevnar 20) (Completed)     Assessment and Plan Assessment & Plan Post-operative care following ACL reconstruction   He is 8 weeks post-operative from bridge-enhanced ACL reconstruction on the right  knee. This technique aims to regrow the ACL, potentially offering better long-term outcomes despite slower initial recovery. He reports swelling with overexertion and is undergoing physical therapy. He uses crutches for support as needed and is not participating in activities like basketball. Continue physical therapy at [facility] in [place]. Use crutches for support as needed. Adjust activity level to manage swelling.  Osteoarthritis of the knee   He is taking Celebrex for knee pain management post-ACL surgery and uses Tylenol as needed for additional pain relief. Continue Celebrex.  Use Tylenol as needed.  Type 2 Diabetes Mellitus   He is on metformin and Mounjaro. Blood sugars have been well-controlled. A weight loss of 45 pounds may positively impact glycemic control. Continue metformin. Continue Mounjaro 10 mg. Monitor blood sugars regularly.  Hyperlipidemia   He is on rosuvastatin. Weight loss may positively impact lipid levels. Continue rosuvastatin. Order lipid panel at next visit.  General Health Maintenance   He is up to date with eye exams and dental visits and was cleared by a dermatologist five months ago. He is considering a pneumonia vaccine, recommended for individuals over 50 and those with diabetes, lasting five years with minimal side effects. Administer pneumonia vaccine. Continue regular eye exams. Continue regular dental visits.    No follow-ups on file.    Donato Schultz, DO

## 2023-06-23 NOTE — Assessment & Plan Note (Signed)
 Ghm utd Check labs  See AVS  Health Maintenance  Topic Date Due   HIV Screening  Never done   Diabetic kidney evaluation - Urine ACR  Never done   Zoster Vaccines- Shingrix (1 of 2) Never done   Diabetic kidney evaluation - eGFR measurement  08/31/2022   COVID-19 Vaccine (3 - 2024-25 season) 11/20/2022   INFLUENZA VACCINE  10/20/2023   Colonoscopy  02/05/2027   DTaP/Tdap/Td (2 - Td or Tdap) 11/28/2028   Pneumococcal Vaccine 58-52 Years old  Completed   Hepatitis C Screening  Completed   HPV VACCINES  Aged Out

## 2023-06-28 DIAGNOSIS — M6281 Muscle weakness (generalized): Secondary | ICD-10-CM | POA: Diagnosis not present

## 2023-06-28 DIAGNOSIS — Z9889 Other specified postprocedural states: Secondary | ICD-10-CM | POA: Diagnosis not present

## 2023-06-28 DIAGNOSIS — M25661 Stiffness of right knee, not elsewhere classified: Secondary | ICD-10-CM | POA: Diagnosis not present

## 2023-06-30 ENCOUNTER — Other Ambulatory Visit (HOSPITAL_BASED_OUTPATIENT_CLINIC_OR_DEPARTMENT_OTHER): Payer: Self-pay

## 2023-07-02 ENCOUNTER — Encounter: Payer: Self-pay | Admitting: Family Medicine

## 2023-07-03 DIAGNOSIS — M6281 Muscle weakness (generalized): Secondary | ICD-10-CM | POA: Diagnosis not present

## 2023-07-03 DIAGNOSIS — M25661 Stiffness of right knee, not elsewhere classified: Secondary | ICD-10-CM | POA: Diagnosis not present

## 2023-07-03 DIAGNOSIS — Z9889 Other specified postprocedural states: Secondary | ICD-10-CM | POA: Diagnosis not present

## 2023-07-05 DIAGNOSIS — M25661 Stiffness of right knee, not elsewhere classified: Secondary | ICD-10-CM | POA: Diagnosis not present

## 2023-07-05 DIAGNOSIS — Z9889 Other specified postprocedural states: Secondary | ICD-10-CM | POA: Diagnosis not present

## 2023-07-05 DIAGNOSIS — M6281 Muscle weakness (generalized): Secondary | ICD-10-CM | POA: Diagnosis not present

## 2023-07-10 DIAGNOSIS — Z9889 Other specified postprocedural states: Secondary | ICD-10-CM | POA: Diagnosis not present

## 2023-07-10 DIAGNOSIS — M25661 Stiffness of right knee, not elsewhere classified: Secondary | ICD-10-CM | POA: Diagnosis not present

## 2023-07-10 DIAGNOSIS — M6281 Muscle weakness (generalized): Secondary | ICD-10-CM | POA: Diagnosis not present

## 2023-07-12 DIAGNOSIS — Z9889 Other specified postprocedural states: Secondary | ICD-10-CM | POA: Diagnosis not present

## 2023-07-12 DIAGNOSIS — M6281 Muscle weakness (generalized): Secondary | ICD-10-CM | POA: Diagnosis not present

## 2023-07-12 DIAGNOSIS — M25661 Stiffness of right knee, not elsewhere classified: Secondary | ICD-10-CM | POA: Diagnosis not present

## 2023-07-14 DIAGNOSIS — Z9889 Other specified postprocedural states: Secondary | ICD-10-CM | POA: Diagnosis not present

## 2023-07-18 DIAGNOSIS — M25661 Stiffness of right knee, not elsewhere classified: Secondary | ICD-10-CM | POA: Diagnosis not present

## 2023-07-18 DIAGNOSIS — M6281 Muscle weakness (generalized): Secondary | ICD-10-CM | POA: Diagnosis not present

## 2023-07-18 DIAGNOSIS — Z9889 Other specified postprocedural states: Secondary | ICD-10-CM | POA: Diagnosis not present

## 2023-07-26 DIAGNOSIS — M6281 Muscle weakness (generalized): Secondary | ICD-10-CM | POA: Diagnosis not present

## 2023-07-26 DIAGNOSIS — Z9889 Other specified postprocedural states: Secondary | ICD-10-CM | POA: Diagnosis not present

## 2023-07-26 DIAGNOSIS — M25661 Stiffness of right knee, not elsewhere classified: Secondary | ICD-10-CM | POA: Diagnosis not present

## 2023-07-29 ENCOUNTER — Other Ambulatory Visit (HOSPITAL_COMMUNITY): Payer: Self-pay

## 2023-08-02 DIAGNOSIS — M6281 Muscle weakness (generalized): Secondary | ICD-10-CM | POA: Diagnosis not present

## 2023-08-02 DIAGNOSIS — M25661 Stiffness of right knee, not elsewhere classified: Secondary | ICD-10-CM | POA: Diagnosis not present

## 2023-08-02 DIAGNOSIS — Z9889 Other specified postprocedural states: Secondary | ICD-10-CM | POA: Diagnosis not present

## 2023-08-09 DIAGNOSIS — M6281 Muscle weakness (generalized): Secondary | ICD-10-CM | POA: Diagnosis not present

## 2023-08-09 DIAGNOSIS — M25661 Stiffness of right knee, not elsewhere classified: Secondary | ICD-10-CM | POA: Diagnosis not present

## 2023-08-09 DIAGNOSIS — Z9889 Other specified postprocedural states: Secondary | ICD-10-CM | POA: Diagnosis not present

## 2023-08-12 ENCOUNTER — Other Ambulatory Visit (HOSPITAL_BASED_OUTPATIENT_CLINIC_OR_DEPARTMENT_OTHER): Payer: Self-pay

## 2023-08-17 DIAGNOSIS — Z9889 Other specified postprocedural states: Secondary | ICD-10-CM | POA: Diagnosis not present

## 2023-08-17 DIAGNOSIS — M6281 Muscle weakness (generalized): Secondary | ICD-10-CM | POA: Diagnosis not present

## 2023-08-17 DIAGNOSIS — M25661 Stiffness of right knee, not elsewhere classified: Secondary | ICD-10-CM | POA: Diagnosis not present

## 2023-08-18 ENCOUNTER — Other Ambulatory Visit (HOSPITAL_BASED_OUTPATIENT_CLINIC_OR_DEPARTMENT_OTHER): Payer: Self-pay

## 2023-08-18 MED ORDER — MOUNJARO 10 MG/0.5ML ~~LOC~~ SOAJ
10.0000 mg | SUBCUTANEOUS | 2 refills | Status: AC
  Filled 2023-08-18: qty 6, 84d supply, fill #0

## 2023-09-05 ENCOUNTER — Other Ambulatory Visit: Payer: Self-pay

## 2023-09-05 ENCOUNTER — Other Ambulatory Visit (HOSPITAL_BASED_OUTPATIENT_CLINIC_OR_DEPARTMENT_OTHER): Payer: Self-pay

## 2023-09-05 MED ORDER — MOUNJARO 10 MG/0.5ML ~~LOC~~ SOAJ
10.0000 mg | SUBCUTANEOUS | 3 refills | Status: DC
  Filled 2023-09-05: qty 6, 84d supply, fill #0

## 2023-09-05 MED ORDER — MOUNJARO 12.5 MG/0.5ML ~~LOC~~ SOAJ
12.5000 mg | SUBCUTANEOUS | 3 refills | Status: AC
Start: 2023-09-05 — End: ?
  Filled 2023-09-05: qty 6, 84d supply, fill #0
  Filled 2023-12-04: qty 6, 84d supply, fill #1

## 2023-09-05 MED ORDER — METFORMIN HCL 500 MG PO TABS
1000.0000 mg | ORAL_TABLET | Freq: Every day | ORAL | 3 refills | Status: AC
  Filled 2023-09-05: qty 180, 90d supply, fill #0
  Filled 2023-12-04: qty 180, 90d supply, fill #1

## 2023-10-30 ENCOUNTER — Other Ambulatory Visit (HOSPITAL_COMMUNITY): Payer: Self-pay

## 2023-11-28 ENCOUNTER — Ambulatory Visit (INDEPENDENT_AMBULATORY_CARE_PROVIDER_SITE_OTHER): Admitting: Family Medicine

## 2023-11-28 ENCOUNTER — Encounter: Payer: Self-pay | Admitting: Family Medicine

## 2023-11-28 VITALS — BP 100/80 | HR 68 | Temp 97.8°F | Resp 16 | Ht 69.0 in | Wt 174.2 lb

## 2023-11-28 DIAGNOSIS — K611 Rectal abscess: Secondary | ICD-10-CM

## 2023-11-28 MED ORDER — CEPHALEXIN 500 MG PO CAPS: 500.0000 mg | ORAL_CAPSULE | Freq: Two times a day (BID) | ORAL | 0 refills | Status: AC

## 2023-11-28 NOTE — Progress Notes (Signed)
 Subjective:    Patient ID: Jose [name], male    DOB: 22-May-1970, 53 y.o.   MRN: [id number]  Chief Complaint  Patient presents with   Mass    Noticed the lump about 5 days ago, no pain.     HPI Patient is in today for lump on L buttock close to rectum.  Discussed the use of AI scribe software for clinical note transcription with the patient, who gave verbal consent to proceed.  History of Present Illness Jose [name] is a 53 year old male who presents with a lump on the left buttock.  He has a 'pedal-sized lump' near the bottom of his left buttock, described as being in the fat/muscle tissue and not attached to the rectum. The lump is approximately the size of a thumbnail and is not currently causing significant discomfort.  He has a history of hemorrhoids, which were previously treated. He suspects they may have recurred slightly, as he experiences mild symptoms, primarily itching, mostly at night.  He has a history of knee issues, having undergone surgery for a completely torn ACL, lateral meniscus, and partial medial meniscus. He reports significant improvement post-surgery, although recovery was lengthy, involving extensive physical therapy to regain muscle mass.  No allergies.   Past Medical History:  Diagnosis Date   Arthritis    R hip   Constipation    Diabetes (HCC)    Fatigue    Herpes    Insomnia    Prediabetes    SOB (shortness of breath) on exertion    Swallowing difficulty     Past Surgical History:  Procedure Laterality Date   R knee bridge enhanced acl replacement Right 04/28/2023   [facility] --  Dr wittstein    Family History  Problem Relation Age of Onset   Diabetes Mother    Diabetes Mellitus II Mother    Dementia Father    Diabetes Father    Hypertension Father    Hyperlipidemia Father    Melanoma Father    Skin cancer Paternal Grandmother    Stomach cancer Paternal Grandmother    Melanoma Paternal Grandmother    Heart attack  Paternal Uncle    Cancer - Other Paternal Uncle        agent orange    Colon cancer Neg Hx    Esophageal cancer Neg Hx    Rectal cancer Neg Hx     Social History   Socioeconomic History   Marital status: Married    Spouse name: Joesiah Lonon   Number of children: 3   Years of education: Not on file   Highest education level: Not on file  Occupational History   Occupation: Designer, television/film set    Comment: [employer]  Tobacco Use   Smoking status: Never   Smokeless tobacco: Never  Vaping Use   Vaping status: Never Used  Substance and Sexual Activity   Alcohol use: Yes    Comment: ocassionally   Drug use: No   Sexual activity: Yes    Partners: Female  Other Topics Concern   Not on file  Social History Narrative   Exercise some --- basketball 4-5 x a week for 40 min   Social Drivers of Corporate investment banker Strain: Low Risk  (02/20/2023)   Received from [facility]   Overall Financial Resource Strain (CARDIA)    Difficulty of Paying Living Expenses: Not hard at all  Food Insecurity: No Food Insecurity (02/20/2023)   Received  from [facility]   Hunger Vital Sign    Within the past 12 months, you worried that your food would run out before you got the money to buy more.: Never true    Within the past 12 months, the food you bought just didn't last and you didn't have money to get more.: Never true  Transportation Needs: No Transportation Needs (02/20/2023)   Received from [facility] Transportation    In the past 12 months, has lack of transportation kept you from medical appointments or from getting medications?: No    Lack of Transportation (Non-Medical): No  Physical Activity: Not on file  Stress: Not on file  Social Connections: Unknown (07/23/2021)   Received from [facility]   Social Network    Social Network: Not on file  Intimate Partner Violence: Unknown (06/21/2021)   Received from  [facility]   HITS    Physically Hurt: Not on file    Insult or Talk Down To: Not on file    Threaten Physical Harm: Not on file    Scream or Curse: Not on file    Outpatient Medications Prior to Visit  Medication Sig Dispense Refill   b complex vitamins capsule Take 1 capsule by mouth daily as needed.     BABY ASPIRIN PO Take 1 tablet by mouth as needed.     Cholecalciferol (VITAMIN D3 PO) Take 1 tablet by mouth daily as needed.     Coenzyme Q10 (CO Q 10 PO) Take 1 tablet by mouth as needed.     metFORMIN  (GLUCOPHAGE ) 500 MG tablet Take 1 tablet (500 mg total) by mouth 2 (two) times daily with a meal. 60 tablet 0   metFORMIN  (GLUCOPHAGE ) 500 MG tablet Take 2 tablets (1,000 mg total) by mouth daily with breakfast 180 tablet 3   Omega-3 Fatty Acids (FISH OIL PO) Take 1 tablet by mouth daily as needed.     rosuvastatin  (CRESTOR ) 40 MG tablet Take 1 tablet (40 mg total) by mouth daily. 90 tablet 1   tirzepatide  (MOUNJARO ) 10 MG/0.5ML Pen Inject 10 mg into the skin once a week. 6 mL 2   tirzepatide  (MOUNJARO ) 10 MG/0.5ML Pen Inject 10 mg into the skin once a week. 6 mL 2   tirzepatide  (MOUNJARO ) 12.5 MG/0.5ML Pen Inject 12.5 mg into the skin every 7 (seven) days. 6 mL 3   Turmeric (QC TUMERIC COMPLEX PO) Take 1 tablet by mouth daily as needed.     valACYclovir  (VALTREX ) 1000 MG tablet Take 1 tablet (1,000 mg total) by mouth daily. 30 tablet 0   No facility-administered medications prior to visit.    No Known Allergies  Review of Systems  Constitutional:  Negative for fever and malaise/fatigue.  HENT:  Negative for congestion.   Eyes:  Negative for blurred vision.  Respiratory:  Negative for shortness of breath.   Cardiovascular:  Negative for chest pain, palpitations and leg swelling.  Gastrointestinal:  Negative for abdominal pain, blood in stool and nausea.  Genitourinary:  Negative for dysuria and frequency.  Musculoskeletal:  Negative for falls.  Skin:  Negative for rash.   Neurological:  Negative for dizziness, loss of consciousness and headaches.  Endo/Heme/Allergies:  Negative for environmental allergies.  Psychiatric/Behavioral:  Negative for depression. The patient is not nervous/anxious.        Objective:    Physical Exam Vitals and nursing note reviewed.  Constitutional:      General: He  is not in acute distress.    Appearance: Normal appearance. He is well-developed.  HENT:     Head: Normocephalic and atraumatic.  Eyes:     General: No scleral icterus.       Right eye: No discharge.        Left eye: No discharge.  Cardiovascular:     Rate and Rhythm: Normal rate and regular rhythm.     Heart sounds: No murmur heard. Pulmonary:     Effort: Pulmonary effort is normal. No respiratory distress.     Breath sounds: Normal breath sounds.  Genitourinary:    Rectum: Mass and tenderness present.      Comments: 1 1/2 in peri rectal mass-- tender to touch No drainage  Chaperone present--  portia  Musculoskeletal:        General: Normal range of motion.     Cervical back: Normal range of motion and neck supple.     Right lower leg: No edema.     Left lower leg: No edema.  Skin:    General: Skin is warm and dry.  Neurological:     Mental Status: He is alert and oriented to person, place, and time.  Psychiatric:        Mood and Affect: Mood normal.        Behavior: Behavior normal.        Thought Content: Thought content normal.        Judgment: Judgment normal.     BP 100/80 (BP Location: Left Arm, Patient Position: Sitting, Cuff Size: Normal)   Pulse 68   Temp 97.8 F (36.6 C) (Oral)   Resp 16   Ht 5' 9 (1.753 m)   Wt 174 lb 3.2 oz (79 kg)   SpO2 98%   BMI 25.72 kg/m  Wt Readings from Last 3 Encounters:  11/28/23 174 lb 3.2 oz (79 kg)  06/23/23 170 lb 3.2 oz (77.2 kg)  02/01/22 206 lb 3.2 oz (93.5 kg)    Diabetic Foot Exam - Simple   No data filed    Lab Results  Component Value Date   WBC 6.8 06/23/2023   HGB 15.3  06/23/2023   HCT 43.5 06/23/2023   PLT 302.0 06/23/2023   GLUCOSE 105 (H) 06/23/2023   CHOL 134 06/23/2023   TRIG 91.0 06/23/2023   HDL 47.40 06/23/2023   LDLDIRECT 167.0 08/30/2021   LDLCALC 68 06/23/2023   ALT 34 06/23/2023   AST 24 06/23/2023   NA 141 06/23/2023   K 4.9 06/23/2023   CL 104 06/23/2023   CREATININE 0.90 06/23/2023   BUN 16 06/23/2023   CO2 28 06/23/2023   TSH 3.09 05/27/2021   PSA 1.00 06/23/2023   HGBA1C 5.0 06/23/2023   MICROALBUR 1.0 06/23/2023    Lab Results  Component Value Date   TSH 3.09 05/27/2021   Lab Results  Component Value Date   WBC 6.8 06/23/2023   HGB 15.3 06/23/2023   HCT 43.5 06/23/2023   MCV 91.8 06/23/2023   PLT 302.0 06/23/2023   Lab Results  Component Value Date   NA 141 06/23/2023   K 4.9 06/23/2023   CO2 28 06/23/2023   GLUCOSE 105 (H) 06/23/2023   BUN 16 06/23/2023   CREATININE 0.90 06/23/2023   BILITOT 1.1 06/23/2023   ALKPHOS 54 06/23/2023   AST 24 06/23/2023   ALT 34 06/23/2023   PROT 7.1 06/23/2023   ALBUMIN 5.1 06/23/2023   CALCIUM  9.9 06/23/2023   GFR 98.93 06/23/2023  Lab Results  Component Value Date   CHOL 134 06/23/2023   Lab Results  Component Value Date   HDL 47.40 06/23/2023   Lab Results  Component Value Date   LDLCALC 68 06/23/2023   Lab Results  Component Value Date   TRIG 91.0 06/23/2023   Lab Results  Component Value Date   CHOLHDL 3 06/23/2023   Lab Results  Component Value Date   HGBA1C 5.0 06/23/2023       Assessment & Plan:  Perirectal abscess -     Cephalexin ; Take 1 capsule (500 mg total) by mouth 2 (two) times daily.  Dispense: 20 capsule; Refill: 0 -     Ambulatory referral to General Surgery   Assessment and Plan Assessment & Plan Left buttock abscess   He presents with a pedal-sized abscess in the fat/muscle tissue of the left buttock, not attached to the rectum. It is not causing significant discomfort but may become more painful if it enlarges. Malignancy  was considered but clinical assessment confirmed an abscess. No known allergies. Refer to a surgeon for drainage. Advise warm compresses and soaking in a warm hot tub with Epsom salt to encourage drainage. Instruct to monitor the abscess and report if it worsens for expedited referral.   Jarrid Lienhard R Lowne Chase, DO

## 2023-12-04 ENCOUNTER — Other Ambulatory Visit: Payer: Self-pay

## 2024-01-06 ENCOUNTER — Other Ambulatory Visit: Payer: Self-pay | Admitting: Family Medicine

## 2024-01-06 DIAGNOSIS — E1169 Type 2 diabetes mellitus with other specified complication: Secondary | ICD-10-CM

## 2024-03-07 ENCOUNTER — Other Ambulatory Visit (HOSPITAL_BASED_OUTPATIENT_CLINIC_OR_DEPARTMENT_OTHER): Payer: Self-pay

## 2024-03-07 MED ORDER — MOUNJARO 15 MG/0.5ML ~~LOC~~ SOAJ
15.0000 mg | SUBCUTANEOUS | 3 refills | Status: AC
  Filled 2024-03-07: qty 6, 84d supply, fill #0
